# Patient Record
Sex: Male | Born: 1975 | Race: Black or African American | Hispanic: No | Marital: Married | Smoking: Never smoker
Health system: Southern US, Community
[De-identification: ages and names within clinical notes are randomized; demographics above are authoritative.]

## PROBLEM LIST (undated history)

## (undated) DIAGNOSIS — K219 Gastro-esophageal reflux disease without esophagitis: Secondary | ICD-10-CM

## (undated) DIAGNOSIS — J302 Other seasonal allergic rhinitis: Secondary | ICD-10-CM

## (undated) DIAGNOSIS — E785 Hyperlipidemia, unspecified: Secondary | ICD-10-CM

## (undated) DIAGNOSIS — S83209A Unspecified tear of unspecified meniscus, current injury, unspecified knee, initial encounter: Secondary | ICD-10-CM

## (undated) HISTORY — DX: Gastro-esophageal reflux disease without esophagitis: K21.9

## (undated) HISTORY — DX: Unspecified tear of unspecified meniscus, current injury, unspecified knee, initial encounter: S83.209A

## (undated) HISTORY — DX: Other seasonal allergic rhinitis: J30.2

## (undated) HISTORY — PX: VASECTOMY: SHX75

## (undated) HISTORY — DX: Hyperlipidemia, unspecified: E78.5

---

## 2005-04-20 HISTORY — PX: MASS EXCISION: SHX2000

## 2010-09-07 ENCOUNTER — Inpatient Hospital Stay (INDEPENDENT_AMBULATORY_CARE_PROVIDER_SITE_OTHER)
Admission: RE | Admit: 2010-09-07 | Discharge: 2010-09-07 | Disposition: A | Discharge status: Home / Self Care | Payer: BC Managed Care – PPO | Source: Ambulatory Visit | Attending: Family Medicine | Admitting: Family Medicine

## 2010-09-07 DIAGNOSIS — IMO0002 Reserved for concepts with insufficient information to code with codable children: Secondary | ICD-10-CM

## 2011-09-08 ENCOUNTER — Ambulatory Visit: Payer: BC Managed Care – PPO | Admitting: Family Medicine

## 2011-09-08 DIAGNOSIS — Z0289 Encounter for other administrative examinations: Secondary | ICD-10-CM

## 2012-05-13 ENCOUNTER — Encounter: Payer: Self-pay | Admitting: Family Medicine

## 2012-05-13 ENCOUNTER — Ambulatory Visit (INDEPENDENT_AMBULATORY_CARE_PROVIDER_SITE_OTHER): Payer: BC Managed Care – PPO | Admitting: Family Medicine

## 2012-05-13 VITALS — BP 118/76 | HR 94 | Temp 98.7°F | Ht 70.5 in | Wt 239.3 lb

## 2012-05-13 DIAGNOSIS — E785 Hyperlipidemia, unspecified: Secondary | ICD-10-CM

## 2012-05-13 LAB — HEPATIC FUNCTION PANEL
ALT: 30 U/L (ref 0–53)
AST: 20 U/L (ref 0–37)
Bilirubin, Direct: 0.2 mg/dL (ref 0.0–0.3)
Total Bilirubin: 0.3 mg/dL (ref 0.3–1.2)
Total Protein: 7.8 g/dL (ref 6.0–8.3)

## 2012-05-13 LAB — BASIC METABOLIC PANEL
BUN: 12 mg/dL (ref 6–23)
CO2: 28 mEq/L (ref 19–32)
Chloride: 102 mEq/L (ref 96–112)
Creatinine, Ser: 1 mg/dL (ref 0.4–1.5)
Potassium: 3.7 mEq/L (ref 3.5–5.1)

## 2012-05-13 LAB — LIPID PANEL
Cholesterol: 294 mg/dL — ABNORMAL HIGH (ref 0–200)
Total CHOL/HDL Ratio: 10
Triglycerides: 456 mg/dL — ABNORMAL HIGH (ref 0.0–149.0)

## 2012-05-13 LAB — LDL CHOLESTEROL, DIRECT: Direct LDL: 185.1 mg/dL

## 2012-05-13 NOTE — Progress Notes (Signed)
Subjective:    Vincent Pollard is a 37 y.o. male here for follow up of dyslipidemia. The patient does not use medications that may worsen dyslipidemias (corticosteroids, progestins, anabolic steroids, diuretics, beta-blockers, amiodarone, cyclosporine, olanzapine). The patient exercises infrequently. The patient is not known to have coexisting coronary artery disease.   Cardiac Risk Factors Age > 45-male, > 55-male:  NO  Smoking:   NO  Sig. family hx of CHD*:  YES  +1  Hypertension:   NO  Diabetes:   NO  HDL < 35:   NO  HDL > 59:   NO  Total: 1   *- Sig. family h/o CHD per NCEP = MI or sudden death at <55yo in  father or other 1st-degree male relative, or <65yo in mother or  other 1st-degree male relative  The following portions of the patient's history were reviewed and updated as appropriate:  He  has a past medical history of Hyperlipidemia. He  does not have a problem list on file. He  has past surgical history that includes Mass excision (2007). His family history includes Diabetes in his maternal grandfather, maternal grandmother, and mother; Hypertension in his father, mother, and sister; and Stroke in his maternal grandfather. He  reports that he has never smoked. He has never used smokeless tobacco. He reports that he does not drink alcohol or use illicit drugs. He currently has no medications in their medication list. No current outpatient prescriptions on file prior to visit.   He is allergic to penicillins..  Review of Systems Pertinent items are noted in HPI.    Objective:    BP 118/76  Pulse 94  Temp 98.7 F (37.1 C) (Oral)  Ht 5' 10.5" (1.791 m)  Wt 239 lb 4.8 oz (108.546 kg)  BMI 33.85 kg/m2  SpO2 98% General appearance: alert, cooperative, appears stated age and no distress Lungs: clear to auscultation bilaterally Heart: regular rate and rhythm, S1, S2 normal, no murmur, click, rub or gallop Extremities: extremities normal, atraumatic, no cyanosis  or edema  Lab Review No results found for this basename: CHOL,  TRIGLYCERIDE,  HDL,  LDLDIRECT      Assessment:    Dyslipidemia as detailed above with 1 CHD risk factors using NCEP scheme above.  Target levels for LDL are: < 130 mg/dl (2 or more risk factors are present)  Explained to the patient the respective contributions of genetics, diet, and exercise to lipid levels and the use of medication in severe cases which do not respond to lifestyle alteration. The patient's interest and motivation in making lifestyle changes seems excellent.    Plan:    The following changes are planned for the next 3 months, at which time the patient will return for repeat fasting lipids:  1. Dietary changes: Increase soluble fiber Plant sterols 2grams per day (e.g. Benecol): yes Reduce saturated fat, "trans" monounsaturated fatty acids, and cholesterol 2. Exercise changes:  advised to con't to work on exercise 3. Other treatment: None 4. Lipid-lowering medications: None at present. Will consider if LDL > 130 on recheck.  (Recommended by NCEP after 3-6 mos of dietary therapy & lifestyle modification,  except if CHD is present or LDL well above 190.) 5. 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 Screening for secondary causes of dyslipidemias: None indicated 7. Lipid screening for relatives: na 8. Follow up: 3 months.  Note: The majority of the visit was spent in counseling on the pathophysiology and treatment of dyslipidemias. The  total face-to-face time was in excess of 25 minutes.

## 2012-05-13 NOTE — Patient Instructions (Addendum)

## 2013-04-20 HISTORY — PX: SHOULDER SURGERY: SHX246

## 2013-06-01 ENCOUNTER — Emergency Department (HOSPITAL_COMMUNITY): Payer: BC Managed Care – PPO

## 2013-06-01 ENCOUNTER — Emergency Department (INDEPENDENT_AMBULATORY_CARE_PROVIDER_SITE_OTHER)
Admission: EM | Admit: 2013-06-01 | Discharge: 2013-06-01 | Disposition: A | Discharge status: Nursing Home (Includes ICF) | Payer: BC Managed Care – PPO | Source: Home / Self Care | Attending: Emergency Medicine | Admitting: Emergency Medicine

## 2013-06-01 ENCOUNTER — Emergency Department (INDEPENDENT_AMBULATORY_CARE_PROVIDER_SITE_OTHER): Payer: BC Managed Care – PPO

## 2013-06-01 ENCOUNTER — Encounter (HOSPITAL_COMMUNITY): Payer: Self-pay | Admitting: Emergency Medicine

## 2013-06-01 ENCOUNTER — Emergency Department (HOSPITAL_COMMUNITY)
Admission: EM | Admit: 2013-06-01 | Discharge: 2013-06-01 | Disposition: A | Discharge status: Home / Self Care | Payer: BC Managed Care – PPO | Attending: Emergency Medicine | Admitting: Emergency Medicine

## 2013-06-01 DIAGNOSIS — Z8639 Personal history of other endocrine, nutritional and metabolic disease: Secondary | ICD-10-CM | POA: Insufficient documentation

## 2013-06-01 DIAGNOSIS — Z880 Allergy status to penicillin: Secondary | ICD-10-CM | POA: Insufficient documentation

## 2013-06-01 DIAGNOSIS — Y92410 Unspecified street and highway as the place of occurrence of the external cause: Secondary | ICD-10-CM | POA: Insufficient documentation

## 2013-06-01 DIAGNOSIS — M5412 Radiculopathy, cervical region: Secondary | ICD-10-CM

## 2013-06-01 DIAGNOSIS — S139XXA Sprain of joints and ligaments of unspecified parts of neck, initial encounter: Secondary | ICD-10-CM

## 2013-06-01 DIAGNOSIS — M542 Cervicalgia: Secondary | ICD-10-CM

## 2013-06-01 DIAGNOSIS — Y9389 Activity, other specified: Secondary | ICD-10-CM | POA: Insufficient documentation

## 2013-06-01 DIAGNOSIS — S43429A Sprain of unspecified rotator cuff capsule, initial encounter: Secondary | ICD-10-CM

## 2013-06-01 DIAGNOSIS — S0993XA Unspecified injury of face, initial encounter: Secondary | ICD-10-CM | POA: Insufficient documentation

## 2013-06-01 DIAGNOSIS — T148XXA Other injury of unspecified body region, initial encounter: Secondary | ICD-10-CM

## 2013-06-01 DIAGNOSIS — S46019A Strain of muscle(s) and tendon(s) of the rotator cuff of unspecified shoulder, initial encounter: Secondary | ICD-10-CM

## 2013-06-01 DIAGNOSIS — S199XXA Unspecified injury of neck, initial encounter: Principal | ICD-10-CM

## 2013-06-01 DIAGNOSIS — S46909A Unspecified injury of unspecified muscle, fascia and tendon at shoulder and upper arm level, unspecified arm, initial encounter: Secondary | ICD-10-CM | POA: Insufficient documentation

## 2013-06-01 DIAGNOSIS — S161XXA Strain of muscle, fascia and tendon at neck level, initial encounter: Secondary | ICD-10-CM

## 2013-06-01 DIAGNOSIS — Z862 Personal history of diseases of the blood and blood-forming organs and certain disorders involving the immune mechanism: Secondary | ICD-10-CM | POA: Insufficient documentation

## 2013-06-01 DIAGNOSIS — S4980XA Other specified injuries of shoulder and upper arm, unspecified arm, initial encounter: Secondary | ICD-10-CM | POA: Insufficient documentation

## 2013-06-01 DIAGNOSIS — IMO0002 Reserved for concepts with insufficient information to code with codable children: Secondary | ICD-10-CM | POA: Insufficient documentation

## 2013-06-01 MED ORDER — ACETAMINOPHEN 325 MG PO TABS
650.0000 mg | ORAL_TABLET | Freq: Once | ORAL | Status: AC
  Administered 2013-06-01: 650 mg via ORAL

## 2013-06-01 MED ORDER — ACETAMINOPHEN 325 MG PO TABS
ORAL_TABLET | ORAL | Status: AC
  Filled 2013-06-01: qty 2

## 2013-06-01 MED ORDER — NAPROXEN 500 MG PO TABS: 500.0000 mg | ORAL_TABLET | Freq: Two times a day (BID) | ORAL | Status: DC

## 2013-06-01 MED ORDER — TRAMADOL HCL 50 MG PO TABS: 50.0000 mg | ORAL_TABLET | Freq: Four times a day (QID) | ORAL | Status: DC | PRN

## 2013-06-01 MED ORDER — METHOCARBAMOL 500 MG PO TABS: 1000.0000 mg | ORAL_TABLET | Freq: Four times a day (QID) | ORAL | Status: DC

## 2013-06-01 NOTE — Discharge Instructions (Signed)
We have determined that your problem requires further evaluation in the emergency department.  We will take care of your transport there.  Once at the emergency department, you will be evaluated by a provider and they will order whatever treatment or tests they deem necessary.  We cannot guarantee that they will do any specific test or do any specific treatment.  ° °
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
## 2013-06-01 NOTE — ED Notes (Signed)
[place]

## 2013-06-01 NOTE — ED Notes (Signed)
pty  Reports  Weakness and  Tingling  l  Arm  And  Pain l  shiulder  Neck   Area  He  Remains  Awake  Alert
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
## 2013-06-08 ENCOUNTER — Ambulatory Visit (INDEPENDENT_AMBULATORY_CARE_PROVIDER_SITE_OTHER): Payer: BC Managed Care – PPO | Admitting: Family Medicine

## 2013-06-08 ENCOUNTER — Encounter: Payer: Self-pay | Admitting: Family Medicine

## 2013-06-08 VITALS — BP 124/70 | HR 102 | Temp 99.2°F | Wt 230.0 lb

## 2013-06-08 DIAGNOSIS — M25519 Pain in unspecified shoulder: Secondary | ICD-10-CM

## 2013-06-08 DIAGNOSIS — S139XXA Sprain of joints and ligaments of unspecified parts of neck, initial encounter: Secondary | ICD-10-CM

## 2013-06-08 DIAGNOSIS — E669 Obesity, unspecified: Secondary | ICD-10-CM | POA: Insufficient documentation

## 2013-06-08 DIAGNOSIS — M25512 Pain in left shoulder: Secondary | ICD-10-CM | POA: Insufficient documentation

## 2013-06-08 MED ORDER — HYDROCODONE-ACETAMINOPHEN 5-325 MG PO TABS: 1.0000 | ORAL_TABLET | Freq: Four times a day (QID) | ORAL | Status: DC | PRN

## 2013-06-08 MED ORDER — TRAMADOL HCL 50 MG PO TABS: 50.0000 mg | ORAL_TABLET | Freq: Four times a day (QID) | ORAL | Status: DC | PRN

## 2013-06-08 MED ORDER — CYCLOBENZAPRINE HCL 10 MG PO TABS: 10.0000 mg | ORAL_TABLET | Freq: Three times a day (TID) | ORAL | Status: DC | PRN
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
## 2013-06-08 NOTE — Assessment & Plan Note (Signed)
Change meds--  D/c robaxin --not effective Can still use tramadol during day vicodin and flexeril for night F/u ortho Sling given to pt

## 2013-06-08 NOTE — Progress Notes (Signed)
Pre visit review using our clinic review tool, if applicable. No additional management support is needed unless otherwise documented below in the visit note. 
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
## 2013-06-14 ENCOUNTER — Telehealth: Payer: Self-pay | Admitting: *Deleted
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
## 2013-06-14 NOTE — Telephone Encounter (Signed)
Ok to give note for light duty for 2 weeks--- lift no more than 10 lbs

## 2013-06-16 NOTE — Telephone Encounter (Signed)
Patient will pick up letter on Monday, I advised if he can get a fax number he could call back with and I can fax it. He agreed and will call if he can get a fax number     KP

## 2013-08-18 ENCOUNTER — Other Ambulatory Visit: Payer: Self-pay | Admitting: Family Medicine

## 2013-08-23 ENCOUNTER — Other Ambulatory Visit: Payer: Self-pay | Admitting: Family Medicine

## 2013-09-12 ENCOUNTER — Other Ambulatory Visit: Payer: Self-pay | Admitting: Specialist

## 2013-09-12 DIAGNOSIS — M25512 Pain in left shoulder: Secondary | ICD-10-CM

## 2013-09-19 ENCOUNTER — Other Ambulatory Visit: Payer: BC Managed Care – PPO

## 2015-09-26 ENCOUNTER — Encounter: Payer: Self-pay | Admitting: Family Medicine

## 2015-09-26 ENCOUNTER — Ambulatory Visit (INDEPENDENT_AMBULATORY_CARE_PROVIDER_SITE_OTHER): Payer: BLUE CROSS/BLUE SHIELD | Admitting: Family Medicine

## 2015-09-26 VITALS — BP 124/72 | HR 88 | Temp 98.5°F | Ht 70.5 in | Wt 225.4 lb

## 2015-09-26 DIAGNOSIS — E785 Hyperlipidemia, unspecified: Secondary | ICD-10-CM

## 2015-09-26 DIAGNOSIS — R079 Chest pain, unspecified: Secondary | ICD-10-CM | POA: Insufficient documentation

## 2015-09-26 DIAGNOSIS — R0789 Other chest pain: Secondary | ICD-10-CM

## 2015-09-26 LAB — LIPID PANEL
Cholesterol: 275 mg/dL — ABNORMAL HIGH (ref 0–200)
HDL: 38 mg/dL — ABNORMAL LOW (ref >39.00)
LDL CALC: 213 mg/dL — AB (ref 0–99)
NonHDL: 237.38
Total CHOL/HDL Ratio: 7
Triglycerides: 122 mg/dL (ref 0.0–149.0)
VLDL: 24.4 mg/dL (ref 0.0–40.0)

## 2015-09-26 LAB — COMPREHENSIVE METABOLIC PANEL
ALBUMIN: 4.9 g/dL (ref 3.5–5.2)
ALK PHOS: 39 U/L (ref 39–117)
ALT: 28 U/L (ref 0–53)
AST: 15 U/L (ref 0–37)
BUN: 11 mg/dL (ref 6–23)
CO2: 31 mEq/L (ref 19–32)
Calcium: 10.1 mg/dL (ref 8.4–10.5)
Chloride: 102 mEq/L (ref 96–112)
Creatinine, Ser: 1.01 mg/dL (ref 0.40–1.50)
GFR: 104.61 mL/min (ref >60.00)
Glucose, Bld: 113 mg/dL — ABNORMAL HIGH (ref 70–99)
POTASSIUM: 4.2 meq/L (ref 3.5–5.1)
SODIUM: 140 meq/L (ref 135–145)
TOTAL PROTEIN: 7.9 g/dL (ref 6.0–8.3)
Total Bilirubin: 0.6 mg/dL (ref 0.2–1.2)

## 2015-09-26 LAB — CBC WITH DIFFERENTIAL/PLATELET
BASOS PCT: 0.4 % (ref 0.0–3.0)
Basophils Absolute: 0 10*3/uL (ref 0.0–0.1)
EOS PCT: 2.5 % (ref 0.0–5.0)
Eosinophils Absolute: 0.2 10*3/uL (ref 0.0–0.7)
HCT: 50.4 % (ref 39.0–52.0)
HEMOGLOBIN: 16.7 g/dL (ref 13.0–17.0)
Lymphocytes Relative: 31.4 % (ref 12.0–46.0)
Lymphs Abs: 2 10*3/uL (ref 0.7–4.0)
MCHC: 33.2 g/dL (ref 30.0–36.0)
MCV: 89.2 fl (ref 78.0–100.0)
MONOS PCT: 9.3 % (ref 3.0–12.0)
Monocytes Absolute: 0.6 10*3/uL (ref 0.1–1.0)
Neutro Abs: 3.5 10*3/uL (ref 1.4–7.7)
Neutrophils Relative %: 56.4 % (ref 43.0–77.0)
Platelets: 283 10*3/uL (ref 150.0–400.0)
RBC: 5.65 Mil/uL (ref 4.22–5.81)
RDW: 13 % (ref 11.5–15.5)
WBC: 6.2 10*3/uL (ref 4.0–10.5)

## 2015-09-26 LAB — EKG 12-LEAD

## 2015-09-26 NOTE — Assessment & Plan Note (Addendum)
Atypical--- no more episodes since last weekend Pt instructed to go to ER if he has anymore CP

## 2015-09-26 NOTE — Patient Instructions (Addendum)
Nonspecific Chest Pain  Chest pain can be caused by many different conditions. There is always a chance that your pain could be related to something serious, such as a heart attack or a blood clot in your lungs. Chest pain can also be caused by conditions that are not life-threatening. If you have chest pain, it is very important to follow up with your health care provider. CAUSES  Chest pain can be caused by:  Heartburn.  Pneumonia or bronchitis.  Anxiety or stress.  Inflammation around your heart (pericarditis) or lung (pleuritis or pleurisy).  A blood clot in your lung.  A collapsed lung (pneumothorax). It can develop suddenly on its own (spontaneous pneumothorax) or from trauma to the chest.  Shingles infection (varicella-zoster virus).  Heart attack.  Damage to the bones, muscles, and cartilage that make up your chest wall. This can include:  Bruised bones due to injury.  Strained muscles or cartilage due to frequent or repeated coughing or overwork.  Fracture to one or more ribs.  Sore cartilage due to inflammation (costochondritis). RISK FACTORS  Risk factors for chest pain may include:  Activities that increase your risk for trauma or injury to your chest.  Respiratory infections or conditions that cause frequent coughing.  Medical conditions or overeating that can cause heartburn.  Heart disease or family history of heart disease.  Conditions or health behaviors that increase your risk of developing a blood clot.  Having had chicken pox (varicella zoster). SIGNS AND SYMPTOMS Chest pain can feel like:  Burning or tingling on the surface of your chest or deep in your chest.  Crushing, pressure, aching, or squeezing pain.  Dull or sharp pain that is worse when you move, cough, or take a deep breath.  Pain that is also felt in your back, neck, shoulder, or arm, or pain that spreads to any of these areas. Your chest pain may come and go, or it may stay  constant. DIAGNOSIS Lab tests or other studies may be needed to find the cause of your pain. Your health care provider may have you take a test called an ambulatory ECG (electrocardiogram). An ECG records your heartbeat patterns at the time the test is performed. You may also have other tests, such as:  Transthoracic echocardiogram (TTE). During echocardiography, sound waves are used to create a picture of all of the heart structures and to look at how blood flows through your heart.  Transesophageal echocardiogram (TEE).This is a more advanced imaging test that obtains images from inside your body. It allows your health care provider to see your heart in finer detail.  Cardiac monitoring. This allows your health care provider to monitor your heart rate and rhythm in real time.  Holter monitor. This is a portable device that records your heartbeat and can help to diagnose abnormal heartbeats. It allows your health care provider to track your heart activity for several days, if needed.  Stress tests. These can be done through exercise or by taking medicine that makes your heart beat more quickly.  Blood tests.  Imaging tests. TREATMENT  Your treatment depends on what is causing your chest pain. Treatment may include:  Medicines. These may include:  Acid blockers for heartburn.  Anti-inflammatory medicine.  Pain medicine for inflammatory conditions.  Antibiotic medicine, if an infection is present.  Medicines to dissolve blood clots.  Medicines to treat coronary artery disease.  Supportive care for conditions that do not require medicines. This may include:  Resting.  Applying heat  or cold packs to injured areas.  Limiting activities until pain decreases. HOME CARE INSTRUCTIONS  If you were prescribed an antibiotic medicine, finish it all even if you start to feel better.  Avoid any activities that bring on chest pain.  Do not use any tobacco products, including  cigarettes, chewing tobacco, or electronic cigarettes. If you need help quitting, ask your health care provider.  Do not drink alcohol.  Take medicines only as directed by your health care provider.  Keep all follow-up visits as directed by your health care provider. This is important. This includes any further testing if your chest pain does not go away.  If heartburn is the cause for your chest pain, you may be told to keep your head raised (elevated) while sleeping. This reduces the chance that acid will go from your stomach into your esophagus.  Make lifestyle changes as directed by your health care provider. These may include:  Getting regular exercise. Ask your health care provider to suggest some activities that are safe for you.  Eating a heart-healthy diet. A registered dietitian can help you to learn healthy eating options.  Maintaining a healthy weight.  Managing diabetes, if necessary.  Reducing stress. SEEK MEDICAL CARE IF:  Your chest pain does not go away after treatment.  You have a rash with blisters on your chest.  You have a fever. SEEK IMMEDIATE MEDICAL CARE IF:   Your chest pain is worse.  You have an increasing cough, or you cough up blood.  You have severe abdominal pain.  You have severe weakness.  You faint.  You have chills.  You have sudden, unexplained chest discomfort.  You have sudden, unexplained discomfort in your arms, back, neck, or jaw.  You have shortness of breath at any time.  You suddenly start to sweat, or your skin gets clammy.  You feel nauseous or you vomit.  You suddenly feel light-headed or dizzy.  Your heart begins to beat quickly, or it feels like it is skipping beats. These symptoms may represent a serious problem that is an emergency. Do not wait to see if the symptoms will go away. Get medical help right away. Call your local emergency services (911 in the [place] Do not drive yourself to the hospital.   This  information is not intended to replace advice given to you by your health care provider. Make sure you discuss any questions you have with your health care provider.   Document Released: 01/14/2005 Document Revised: 04/27/2014 Document Reviewed: 11/10/2013 Elsevier Interactive Patient Education [employer]. 3.
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
## 2015-09-26 NOTE — Progress Notes (Signed)
Pre visit review using our clinic review tool, if applicable. No additional management support is needed unless otherwise documented below in the visit note. 

## 2015-09-27 ENCOUNTER — Encounter: Payer: Self-pay | Admitting: Family Medicine

## 2015-09-27 ENCOUNTER — Other Ambulatory Visit: Payer: Self-pay

## 2015-09-27 DIAGNOSIS — R079 Chest pain, unspecified: Secondary | ICD-10-CM

## 2015-09-27 DIAGNOSIS — R9431 Abnormal electrocardiogram [ECG] [EKG]: Secondary | ICD-10-CM

## 2015-09-27 NOTE — Telephone Encounter (Signed)
-----   Message from Ann Held, [place] sent at 09/26/2015  5:48 PM EDT ----- Refer to cardiology--- dx atypical chest pain , abn ekg ----- Message -----    From: Synthia Innocent    Sent: 09/26/2015  12:59 PM      To: Ann Held, DO  Will need peer to peer review for Echo and Stress test, please call [facility] @ [phone], ID # [id number]. Thanks

## 2015-09-27 NOTE — Telephone Encounter (Signed)
Discussed with the patient as well as discussed the above message with him, I made him aware the insurance company will not pay for the Echo and stress test so we will send him to the cardiologist, he verbalized understanding, the referral has been placed. I advised in 2014 we were hoping he would watch his diet and exercise and recheck in 3 months but he never came back, he voiced understanding, I advised once his labs has been resulted she may place him on a medication and if so I would call him back and let him know, he verbalized understanding and was ok with that, he stated he eats out all of the time because his wife can not cook, I advised him to watch what he eats and increase his exercise. He said he would.   KP

## 2015-10-08 ENCOUNTER — Encounter: Payer: Self-pay | Admitting: Family Medicine

## 2015-10-08 MED ORDER — ATORVASTATIN CALCIUM 20 MG PO TABS: 20.0000 mg | ORAL_TABLET | Freq: Every day | ORAL | Status: DC

## 2015-10-10 ENCOUNTER — Telehealth (HOSPITAL_COMMUNITY): Payer: Self-pay | Admitting: *Deleted

## 2015-10-10 NOTE — Telephone Encounter (Signed)
Left message on voicemail per DPR in reference to upcoming appointment scheduled on 6/27/17with detailed instructions given per Myocardial Perfusion Study Information Sheet for the test. LM to arrive 15 minutes early, and that it is imperative to arrive on time for appointment to keep from having the test rescheduled. If you need to cancel or reschedule your appointment, please call the office within 24 hours of your appointment. Failure to do so may result in a cancellation of your appointment, and a $50 no show fee. Phone number given for call back for any questions. Hubbard Robinson, RN

## 2015-10-11 ENCOUNTER — Telehealth: Payer: Self-pay | Admitting: Cardiovascular Disease

## 2015-10-11 NOTE — Telephone Encounter (Signed)
Informed Maudie Mercury that patient should keep echo appointment 6/27. Will forward to Dr. Johnsie Cancel for further advisement. Patient has office visit on 6/28.

## 2015-10-11 NOTE — Telephone Encounter (Signed)
New message   Joelene Millin is calling to see if the echo needs to be canceled and if Dr. Johnsie Cancel may want to order something else after he sees the pt.  She was told that the nuc stress was not approved, but the echo was approved  Please call back to advise

## 2015-10-11 NOTE — Telephone Encounter (Signed)
Will likely order ETT after new patient visit

## 2015-10-11 NOTE — Telephone Encounter (Signed)
Wife has been made aware to keep the Cardiology apt and the Echo apt on 10/15/15

## 2015-10-15 ENCOUNTER — Other Ambulatory Visit: Payer: Self-pay

## 2015-10-15 ENCOUNTER — Encounter (HOSPITAL_COMMUNITY): Payer: BLUE CROSS/BLUE SHIELD

## 2015-10-15 ENCOUNTER — Ambulatory Visit (HOSPITAL_COMMUNITY): Payer: BLUE CROSS/BLUE SHIELD | Attending: Cardiology

## 2015-10-15 DIAGNOSIS — R079 Chest pain, unspecified: Secondary | ICD-10-CM | POA: Diagnosis present

## 2015-10-15 DIAGNOSIS — Z8249 Family history of ischemic heart disease and other diseases of the circulatory system: Secondary | ICD-10-CM | POA: Diagnosis not present

## 2015-10-15 DIAGNOSIS — E785 Hyperlipidemia, unspecified: Secondary | ICD-10-CM | POA: Diagnosis not present

## 2015-10-15 DIAGNOSIS — R0789 Other chest pain: Secondary | ICD-10-CM

## 2015-10-15 DIAGNOSIS — I517 Cardiomegaly: Secondary | ICD-10-CM | POA: Diagnosis not present

## 2015-10-15 DIAGNOSIS — I340 Nonrheumatic mitral (valve) insufficiency: Secondary | ICD-10-CM | POA: Diagnosis not present

## 2015-10-15 LAB — ECHOCARDIOGRAM COMPLETE
AOASC: 30 cm
CHL CUP DOP CALC LVOT VTI: 20.7 cm
E decel time: 250 msec
EERAT: 5.94
FS: 34 % (ref 28–44)
IV/PV OW: 1.05
LA ID, A-P, ES: 40 mm
LA vol A4C: 51.8 ml
LA vol index: 26 mL/m2
LADIAMINDEX: 1.81 cm/m2
LAVOL: 57.4 mL
LEFT ATRIUM END SYS DIAM: 40 mm
LV E/e' medial: 5.94
LV E/e'average: 5.94
LV PW d: 10.9 mm — AB (ref 0.6–1.1)
LV e' LATERAL: 12.8 cm/s
LVOT SV: 94 mL
LVOT area: 4.52 cm2
LVOT peak vel: 101 cm/s
LVOTD: 24 mm
MV Dec: 250
MV pk E vel: 76 m/s
MVPG: 2 mmHg
MVPKAVEL: 59.5 m/s
TAPSE: 21.3 mm
TDI e' lateral: 12.8
TDI e' medial: 8.49
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
## 2015-10-16 ENCOUNTER — Ambulatory Visit (INDEPENDENT_AMBULATORY_CARE_PROVIDER_SITE_OTHER): Payer: BLUE CROSS/BLUE SHIELD | Admitting: Cardiovascular Disease

## 2015-10-16 ENCOUNTER — Encounter: Payer: Self-pay | Admitting: Cardiovascular Disease

## 2015-10-16 VITALS — BP 122/74 | HR 78 | Ht 72.0 in | Wt 227.4 lb

## 2015-10-16 DIAGNOSIS — R0789 Other chest pain: Secondary | ICD-10-CM

## 2015-10-16 NOTE — Patient Instructions (Addendum)
Medication Instructions:  Your physician recommends that you continue on your current medications as directed. Please refer to the Current Medication list given to you today.  Labwork: NONE  Testing/Procedures: Your physician has requested that you have an exercise tolerance test. For further information please visit [email or web address]. Please also follow instruction sheet, as given.  Follow-Up: Your physician wants you to follow-up as needed with Dr. Nishan.    If you need a refill on your cardiac medications before your next appointment, please call your pharmacy.    

## 2015-11-19 DIAGNOSIS — R0989 Other specified symptoms and signs involving the circulatory and respiratory systems: Secondary | ICD-10-CM

## 2015-12-31 ENCOUNTER — Telehealth: Payer: Self-pay | Admitting: Cardiovascular Disease

## 2015-12-31 NOTE — Telephone Encounter (Signed)
I have left several messages for pt to call back and schedule GXT.  Pt has scheduled 2 times and have not kept either appt. I have removed the order from the workque due to reaching the 3rd call no response.   12/31/2015 LMOM for pt to call and schedule GXT. stpegram  12/17/2015 LMOM for pt to call and schedule GXT advised pt if he was not interested in doing the test to please advise so we can let Dr Johnsie Cancel  know why this test has not been scheduled. stpegram 11/27/2015 LMOM for pt to call and scheudle GXT. stpegram  11/19/2015 NO SHOW.

## 2016-01-02 ENCOUNTER — Encounter: Payer: Self-pay | Admitting: Family Medicine

## 2016-01-02 DIAGNOSIS — Z0289 Encounter for other administrative examinations: Secondary | ICD-10-CM

## 2016-01-27 ENCOUNTER — Other Ambulatory Visit: Payer: Self-pay | Admitting: Family Medicine

## 2016-03-06 ENCOUNTER — Other Ambulatory Visit: Payer: Self-pay | Admitting: Family Medicine

## 2016-04-16 ENCOUNTER — Telehealth: Payer: Self-pay | Admitting: Medical

## 2016-04-16 ENCOUNTER — Encounter: Payer: Self-pay | Admitting: Medical

## 2016-04-16 ENCOUNTER — Ambulatory Visit (INDEPENDENT_AMBULATORY_CARE_PROVIDER_SITE_OTHER): Payer: BLUE CROSS/BLUE SHIELD | Admitting: Medical

## 2016-04-16 VITALS — BP 120/74 | HR 89 | Temp 98.0°F | Wt 231.8 lb

## 2016-04-16 DIAGNOSIS — J0100 Acute maxillary sinusitis, unspecified: Secondary | ICD-10-CM | POA: Diagnosis not present

## 2016-04-16 DIAGNOSIS — R059 Cough, unspecified: Secondary | ICD-10-CM

## 2016-04-16 DIAGNOSIS — R05 Cough: Secondary | ICD-10-CM

## 2016-04-16 LAB — POC INFLUENZA A&B (BINAX/QUICKVUE)
INFLUENZA A, POC: NEGATIVE
INFLUENZA B, POC: NEGATIVE

## 2016-04-16 MED ORDER — DOXYCYCLINE HYCLATE 100 MG PO TABS: 100.0000 mg | ORAL_TABLET | Freq: Two times a day (BID) | ORAL | 0 refills | Status: DC

## 2016-04-16 MED ORDER — FLUTICASONE PROPIONATE 50 MCG/ACT NA SUSP: 2.0000 | Freq: Every day | NASAL | 1 refills | Status: DC

## 2016-04-16 MED ORDER — BENZONATATE 100 MG PO CAPS: 100.0000 mg | ORAL_CAPSULE | Freq: Three times a day (TID) | ORAL | 0 refills | Status: DC | PRN

## 2016-04-16 MED ORDER — ATORVASTATIN CALCIUM 20 MG PO TABS: 20.0000 mg | ORAL_TABLET | Freq: Every day | ORAL | 0 refills | Status: DC

## 2016-04-16 NOTE — Patient Instructions (Addendum)
You appear to have a sinus infection. I am prescribing antibiotic doxycycline  for the infection. To help with the nasal congestion I prescribed flonase nasal steroid. For your associated cough , I prescribed cough medicine benzonatate.  Possible early bronchitis as well.  Rest, hydrate, tylenol for fever.  Follow up in 7 days or as needed.

## 2016-04-16 NOTE — Telephone Encounter (Signed)
Will you result pt flu test so I can close chart. Thanks.

## 2016-04-16 NOTE — Progress Notes (Signed)
Pre visit review using our clinic review tool, if applicable. No additional management support is needed unless otherwise documented below in the visit note. 

## 2016-04-16 NOTE — Progress Notes (Signed)
Subjective:    Patient ID: Vincent Pollard, male    DOB: [birth year], 40 y.o.   MRN: [id number]  HPI  Pt in states sick since last Friday. Nasal congestion, runny nose, sinus pain/pressure and some productive cough. When blows nose will gt mucous.   Pt states some very faint transient body aches that last 2 minutes the other day but not now.  Pt states no hx of chronic sinus infection or bronchitis. No wheezing.   Review of Systems  Constitutional: Positive for chills and fatigue. Negative for fever.       Can't sleep due to nasal congestion.  HENT: Positive for congestion, postnasal drip, sinus pain and sinus pressure. Negative for ear pain and sore throat.   Respiratory: Positive for cough. Negative for chest tightness, shortness of breath and wheezing.   Cardiovascular: Negative for chest pain and palpitations.  Gastrointestinal: Negative for abdominal pain.  Musculoskeletal: Negative for back pain and gait problem.  Neurological: Negative for dizziness and headaches.  Hematological: Negative for adenopathy. Does not bruise/bleed easily.  Psychiatric/Behavioral: Negative for behavioral problems, confusion, dysphoric mood and hallucinations.    Past Medical History:  Diagnosis Date  . Hyperlipidemia      Social History   Social History  . Marital status: Married    Spouse name: N/A  . Number of children: N/A  . Years of education: N/A   Occupational History  . Not on file.   Social History Main Topics  . Smoking status: Never Smoker  . Smokeless tobacco: Never Used  . Alcohol use No  . Drug use: No  . Sexual activity: Yes    Partners: Female   Other Topics Concern  . Not on file   Social History Narrative  . No narrative on file    Past Surgical History:  Procedure Laterality Date  . MASS EXCISION  2007   from the left thumb  . SHOULDER SURGERY Left 2015   dr Theda Sers    Family History  Problem Relation Age of Onset  . Hypertension Father   . Stroke  Maternal Grandfather   . Diabetes Maternal Grandfather   . Heart disease Maternal Grandfather   . Hypertension Sister   . Hypertension Mother   . Diabetes Mother   . Diabetes Maternal Grandmother   . Arthritis Paternal Grandmother   . Arthritis Paternal Grandfather     Allergies  Allergen Reactions  . Penicillins Other (See Comments)    Childhood reaction unknown    No current outpatient prescriptions on file prior to visit.   No current facility-administered medications on file prior to visit.     BP 120/74 (BP Location: Left Arm, Patient Position: Sitting, Cuff Size: Normal)   Pulse 89   Temp 98 F (36.7 C) (Oral)   Wt 231 lb 12.8 oz (105.1 kg)   SpO2 96%   BMI 31.44 kg/m       Objective:   Physical Exam  General  Mental Status - Alert. General Appearance - Well groomed. Not in acute distress.  Skin Rashes- No Rashes.  HEENT Head- Normal. Ear Auditory Canal - Left- Normal. Right - Normal.Tympanic Membrane- Left- Normal. Right- Normal. Eye Sclera/Conjunctiva- Left- Normal. Right- Normal. Nose & Sinuses Nasal Mucosa- Left-  Boggy and Congested. Right-  Boggy and  Congested.Bilateral maxillary and frontal sinus pressure. Mouth & Throat Lips: Upper Lip- Normal: no dryness, cracking, pallor, cyanosis, or vesicular eruption. Lower Lip-Normal: no dryness, cracking, pallor, cyanosis or vesicular eruption. Buccal Mucosa- Bilateral-  No Aphthous ulcers. Oropharynx- No Discharge or Erythema. Tonsils: Characteristics- Bilateral- No Erythema or Congestion. Size/Enlargement- Bilateral- No enlargement. Discharge- bilateral-None.  Neck Neck- Supple. No Masses.   Chest and Lung Exam Auscultation: Breath Sounds:-Clear even and unlabored.  Cardiovascular Auscultation:Rythm- Regular, rate and rhythm. Murmurs & Other Heart Sounds:Ausculatation of the heart reveal- No Murmurs.  Lymphatic Head & Neck General Head & Neck Lymphatics: Bilateral: Description- No Localized  lymphadenopathy.      Assessment & Plan:  You appear to have a sinus infection. I am prescribing antibiotic doxycycline  for the infection. To help with the nasal congestion I prescribed flonase nasal steroid. For your associated cough , I prescribed cough medicine benzonatate.  Possible early bronchitis as well.  Rest, hydrate, tylenol for fever.  Follow up in 7 days or as needed.  Pt flu test was negative today.

## 2016-09-20 ENCOUNTER — Other Ambulatory Visit: Payer: Self-pay | Admitting: Medical

## 2016-09-27 ENCOUNTER — Other Ambulatory Visit: Payer: Self-pay | Admitting: Medical

## 2016-09-29 NOTE — Telephone Encounter (Signed)
lvm advising patient of message below °

## 2016-09-29 NOTE — Telephone Encounter (Signed)
Pt due for follow up please call and schedule.  

## 2016-10-01 ENCOUNTER — Other Ambulatory Visit: Payer: Self-pay | Admitting: Family Medicine

## 2016-10-01 MED ORDER — FLUTICASONE PROPIONATE 50 MCG/ACT NA SUSP: 2.0000 | Freq: Every day | NASAL | 1 refills | Status: DC

## 2017-04-20 HISTORY — PX: MENISCUS REPAIR: SHX5179

## 2017-06-02 ENCOUNTER — Ambulatory Visit: Payer: Managed Care, Other (non HMO) | Admitting: Family Medicine

## 2017-06-02 ENCOUNTER — Encounter: Payer: Self-pay | Admitting: Family Medicine

## 2017-06-02 VITALS — BP 132/82 | HR 90 | Temp 98.3°F | Ht 71.0 in | Wt 241.0 lb

## 2017-06-02 DIAGNOSIS — J0100 Acute maxillary sinusitis, unspecified: Secondary | ICD-10-CM | POA: Diagnosis not present

## 2017-06-02 MED ORDER — DOXYCYCLINE HYCLATE 100 MG PO TABS: 100.0000 mg | ORAL_TABLET | Freq: Two times a day (BID) | ORAL | 0 refills | Status: DC

## 2017-06-02 NOTE — Progress Notes (Signed)
Chief Complaint  Patient presents with  . Sinusitis    Vincent Pollard here for URI complaints.  Duration: 2 days  Associated symptoms: sinus headache, sinus congestion, sinus pain and rhinorrhea Denies: itchy watery eyes, ear pain, ear drainage, sore throat, wheezing, shortness of breath, myalgia, cough and fevers Treatment to date: Nyquil, saline spray Sick contacts: Yes  ROS:  Const: Denies fevers HEENT: As noted in HPI Lungs: No SOB  Past Medical History:  Diagnosis Date  . Hyperlipidemia    Family History  Problem Relation Age of Onset  . Hypertension Father   . Stroke Maternal Grandfather   . Diabetes Maternal Grandfather   . Heart disease Maternal Grandfather   . Hypertension Sister   . Hypertension Mother   . Diabetes Mother   . Diabetes Maternal Grandmother   . Arthritis Paternal Grandmother   . Arthritis Paternal Grandfather     BP 132/82 (BP Location: Left Arm, Patient Position: Sitting, Cuff Size: Large)   Pulse 90   Temp 98.3 F (36.8 C) (Oral)   Ht 5\' 11"  (1.803 m)   Wt 241 lb (109.3 kg)   SpO2 96%   BMI 33.61 kg/m  General: Awake, alert, appears stated age HEENT: AT, [place], ears patent b/l and TM's neg, TTP over max sinuses b/l, nares patent w/o discharge, pharynx pink and without exudates, MMM Neck: No masses or asymmetry Heart: RRR Lungs: CTAB, no accessory muscle use Psych: Age appropriate judgment and insight, normal mood and affect  Acute maxillary sinusitis, recurrence not specified - Plan: doxycycline (VIBRA-TABS) 100 MG tablet  Orders as above. Pocket rx given w instructions. Ibuprofen, Tylenol.  Continue to push fluids, practice good hand hygiene, cover mouth when coughing. F/u prn. If starting to experience fevers, shaking, or shortness of breath, seek immediate care. Pt voiced understanding and agreement to the plan.  Laurys [place], DO 06/02/17 2:39 PM

## 2017-06-02 NOTE — Patient Instructions (Addendum)
Most sinus infections are viral in etiology and antibiotics will not be helpful. That being said, if you start having worsening symptoms over 3 days, you are worsening by day 10 or not improving by day 14, go ahead and take it. You are on Day 2 as of now.   Continue to push fluids, practice good hand hygiene, and cover your mouth if you cough.  If you start having fevers, shaking or shortness of breath, seek immediate care.  Ibuprofen 400-600 mg (2-3 over the counter strength tabs) every 6 hours as needed for pain.  OK to take Tylenol 1000 mg (2 extra strength tabs) or 975 mg (3 regular strength tabs) every 6 hours as needed.  Vick's Vaporub, air humidifier, nasal spray (Flonase), elevate head of bed.   Let us know if you need anything.

## 2017-06-02 NOTE — Progress Notes (Signed)
Pre visit review using our clinic review tool, if applicable. No additional management support is needed unless otherwise documented below in the visit note. 

## 2017-06-04 ENCOUNTER — Other Ambulatory Visit: Payer: Self-pay | Admitting: Family Medicine

## 2017-08-08 ENCOUNTER — Other Ambulatory Visit: Payer: Self-pay | Admitting: Family Medicine

## 2017-08-11 ENCOUNTER — Other Ambulatory Visit: Payer: Self-pay | Admitting: *Deleted

## 2017-08-11 MED ORDER — ATORVASTATIN CALCIUM 20 MG PO TABS: ORAL_TABLET | ORAL | 0 refills | Status: DC

## 2017-08-11 NOTE — Telephone Encounter (Signed)
Advised patient that he must keep appt in May to get more refills

## 2017-09-03 ENCOUNTER — Encounter: Payer: Managed Care, Other (non HMO) | Admitting: Family Medicine

## 2017-09-07 ENCOUNTER — Encounter: Payer: Self-pay | Admitting: Family Medicine

## 2017-09-07 ENCOUNTER — Ambulatory Visit (INDEPENDENT_AMBULATORY_CARE_PROVIDER_SITE_OTHER): Payer: Managed Care, Other (non HMO) | Admitting: Family Medicine

## 2017-09-07 VITALS — BP 129/77 | HR 70 | Temp 98.1°F | Resp 16 | Ht 72.0 in | Wt 236.6 lb

## 2017-09-07 DIAGNOSIS — E785 Hyperlipidemia, unspecified: Secondary | ICD-10-CM | POA: Diagnosis not present

## 2017-09-07 NOTE — Progress Notes (Signed)
Subjective:  I acted as a Education administrator for [employer]. Yancey Flemings, [place]   Patient ID: Vincent Pollard, male    DOB: 03/04/1976, 42 y.o.   MRN: [id number]  Chief Complaint  Patient presents with  . medication follow up    HPI  Patient is in today for medication follow up.  No complaints   Pt sometimes has tired legs but it is rare.     Patient Care Team: Carollee Herter, Alferd Apa, DO as PCP - General (Family Medicine)   Past Medical History:  Diagnosis Date  . Hyperlipidemia     Past Surgical History:  Procedure Laterality Date  . MASS EXCISION  2007   from the left thumb  . SHOULDER SURGERY Left 2015   dr Theda Sers    Family History  Problem Relation Age of Onset  . Hypertension Father   . Stroke Maternal Grandfather   . Diabetes Maternal Grandfather   . Heart disease Maternal Grandfather   . Hypertension Sister   . Hypertension Mother   . Diabetes Mother   . Diabetes Maternal Grandmother   . Arthritis Paternal Grandmother   . Arthritis Paternal Grandfather     Social History   Socioeconomic History  . Marital status: Married    Spouse name: Not on file  . Number of children: Not on file  . Years of education: Not on file  . Highest education level: Not on file  Occupational History  . Not on file  Social Needs  . Financial resource strain: Not on file  . Food insecurity:    Worry: Not on file    Inability: Not on file  . Transportation needs:    Medical: Not on file    Non-medical: Not on file  Tobacco Use  . Smoking status: Never Smoker  . Smokeless tobacco: Never Used  Substance and Sexual Activity  . Alcohol use: No  . Drug use: No  . Sexual activity: Yes    Partners: Female  Lifestyle  . Physical activity:    Days per week: Not on file    Minutes per session: Not on file  . Stress: Not on file  Relationships  . Social connections:    Talks on phone: Not on file    Gets together: Not on file    Attends religious service: Not on file    Active  member of club or organization: Not on file    Attends meetings of clubs or organizations: Not on file    Relationship status: Not on file  . Intimate partner violence:    Fear of current or ex partner: Not on file    Emotionally abused: Not on file    Physically abused: Not on file    Forced sexual activity: Not on file  Other Topics Concern  . Not on file  Social History Narrative  . Not on file    Outpatient Medications Prior to Visit  Medication Sig Dispense Refill  . atorvastatin (LIPITOR) 20 MG tablet TAKE 1 TABLET (20 MG TOTAL) BY MOUTH DAILY. 90 tablet 0  . benzonatate (TESSALON) 100 MG capsule Take 1 capsule (100 mg total) by mouth 3 (three) times daily as needed for cough. 21 capsule 0  . doxycycline (VIBRA-TABS) 100 MG tablet Take 1 tablet (100 mg total) by mouth 2 (two) times daily. 20 tablet 0  . fluticasone (FLONASE) 50 MCG/ACT nasal spray Place 2 sprays into both nostrils daily. 16 g 1   No facility-administered medications prior to  visit.     Allergies  Allergen Reactions  . Penicillins Other (See Comments)    Childhood reaction unknown    Review of Systems  Constitutional: Negative for chills, fever and malaise/fatigue.  HENT: Negative for congestion and hearing loss.   Eyes: Negative for discharge.  Respiratory: Negative for cough, sputum production and shortness of breath.   Cardiovascular: Negative for chest pain, palpitations and leg swelling.  Gastrointestinal: Negative for abdominal pain, blood in stool, constipation, diarrhea, heartburn, nausea and vomiting.  Genitourinary: Negative for dysuria, frequency, hematuria and urgency.  Musculoskeletal: Negative for back pain, falls and myalgias.  Skin: Negative for rash.  Neurological: Negative for dizziness, sensory change, loss of consciousness, weakness and headaches.  Endo/Heme/Allergies: Negative for environmental allergies. Does not bruise/bleed easily.  Psychiatric/Behavioral: Negative for depression  and suicidal ideas. The patient is not nervous/anxious and does not have insomnia.        Objective:    Physical Exam  Constitutional: He is oriented to person, place, and time. Vital signs are normal. He appears well-developed and well-nourished. He is sleeping. No distress.  HENT:  Head: Normocephalic and atraumatic.  Mouth/Throat: Oropharynx is clear and moist.  Eyes: Pupils are equal, round, and reactive to light. EOM are normal.  Neck: Normal range of motion. Neck supple. No thyromegaly present.  Cardiovascular: Normal rate, regular rhythm and normal heart sounds.  No murmur heard. Pulmonary/Chest: Effort normal and breath sounds normal. No respiratory distress. He has no wheezes. He has no rales. He exhibits no tenderness.  Musculoskeletal: He exhibits no edema or tenderness.  Neurological: He is alert and oriented to person, place, and time.  Skin: Skin is warm and dry.  Psychiatric: He has a normal mood and affect. His behavior is normal. Judgment and thought content normal.  Nursing note and vitals reviewed.   BP 129/77 (BP Location: Left Arm, Patient Position: Sitting, Cuff Size: Large)   Pulse 70   Temp 98.1 F (36.7 C) (Oral)   Resp 16   Ht 6' (1.829 m)   Wt 236 lb 9.6 oz (107.3 kg)   SpO2 98%   BMI 32.09 kg/m  Wt Readings from Last 3 Encounters:  09/07/17 236 lb 9.6 oz (107.3 kg)  06/02/17 241 lb (109.3 kg)  04/16/16 231 lb 12.8 oz (105.1 kg)   BP Readings from Last 3 Encounters:  09/07/17 129/77  06/02/17 132/82  04/16/16 120/74      There is no immunization history on file for this patient.  Health Maintenance  Topic Date Due  . HIV Screening  07/26/1989  . TETANUS/TDAP  09/08/2018 (Originally 07/26/1993)  . INFLUENZA VACCINE  11/18/2017    Lab Results  Component Value Date   WBC 6.2 09/26/2015   HGB 16.7 09/26/2015   HCT 50.4 09/26/2015   PLT 283.0 09/26/2015   GLUCOSE 113 (H) 09/26/2015   CHOL 275 (H) 09/26/2015   TRIG 122.0 09/26/2015    HDL 38.00 (L) 09/26/2015   LDLDIRECT 185.1 05/13/2012   LDLCALC 213 (H) 09/26/2015   ALT 28 09/26/2015   AST 15 09/26/2015   NA 140 09/26/2015   K 4.2 09/26/2015   CL 102 09/26/2015   CREATININE 1.01 09/26/2015   BUN 11 09/26/2015   CO2 31 09/26/2015    No results found for: TSH Lab Results  Component Value Date   WBC 6.2 09/26/2015   HGB 16.7 09/26/2015   HCT 50.4 09/26/2015   MCV 89.2 09/26/2015   PLT 283.0 09/26/2015   Lab  Results  Component Value Date   NA 140 09/26/2015   K 4.2 09/26/2015   CO2 31 09/26/2015   GLUCOSE 113 (H) 09/26/2015   BUN 11 09/26/2015   CREATININE 1.01 09/26/2015   BILITOT 0.6 09/26/2015   ALKPHOS 39 09/26/2015   AST 15 09/26/2015   ALT 28 09/26/2015   PROT 7.9 09/26/2015   ALBUMIN 4.9 09/26/2015   CALCIUM 10.1 09/26/2015   GFR 104.61 09/26/2015   Lab Results  Component Value Date   CHOL 275 (H) 09/26/2015   Lab Results  Component Value Date   HDL 38.00 (L) 09/26/2015   Lab Results  Component Value Date   LDLCALC 213 (H) 09/26/2015   Lab Results  Component Value Date   TRIG 122.0 09/26/2015   Lab Results  Component Value Date   CHOLHDL 7 09/26/2015   No results found for: HGBA1C       Assessment & Plan:   Problem List Items Addressed This Visit    None    Visit Diagnoses    Hyperlipidemia LDL goal <100    -  Primary   Relevant Orders   Lipid panel   Comprehensive metabolic panel   CK (Creatine Kinase)    Tolerating statin, encouraged heart healthy diet, avoid trans fats, minimize simple carbs and saturated fats. Increase exercise as tolerated  I am having Marcheta Grammes maintain his benzonatate, fluticasone, doxycycline, and atorvastatin.  No orders of the defined types were placed in this encounter.   CMA served as Education administrator during this visit. History, Physical and Plan performed by medical provider. Documentation and orders reviewed and attested to.  Ann Held, DO

## 2017-09-07 NOTE — Patient Instructions (Signed)

## 2017-09-08 LAB — COMPREHENSIVE METABOLIC PANEL
ALBUMIN: 4.7 g/dL (ref 3.5–5.2)
ALK PHOS: 37 U/L — AB (ref 39–117)
ALT: 31 U/L (ref 0–53)
AST: 17 U/L (ref 0–37)
BUN: 10 mg/dL (ref 6–23)
CALCIUM: 10.2 mg/dL (ref 8.4–10.5)
CHLORIDE: 99 meq/L (ref 96–112)
CO2: 30 mEq/L (ref 19–32)
Creatinine, Ser: 0.94 mg/dL (ref 0.40–1.50)
GFR: 112.58 mL/min (ref >60.00)
Glucose, Bld: 84 mg/dL (ref 70–99)
POTASSIUM: 4 meq/L (ref 3.5–5.1)
Sodium: 138 mEq/L (ref 135–145)
TOTAL PROTEIN: 7.4 g/dL (ref 6.0–8.3)
Total Bilirubin: 1 mg/dL (ref 0.2–1.2)

## 2017-09-08 LAB — LIPID PANEL
Cholesterol: 193 mg/dL (ref 0–200)
HDL: 36.6 mg/dL — ABNORMAL LOW (ref >39.00)
LDL Cholesterol: 121 mg/dL — ABNORMAL HIGH (ref 0–99)
NONHDL: 156.44
Total CHOL/HDL Ratio: 5
Triglycerides: 177 mg/dL — ABNORMAL HIGH (ref 0.0–149.0)
VLDL: 35.4 mg/dL (ref 0.0–40.0)

## 2017-09-08 LAB — CK: Total CK: 127 U/L (ref 7–232)

## 2017-09-14 ENCOUNTER — Other Ambulatory Visit: Payer: Self-pay

## 2017-09-14 DIAGNOSIS — E785 Hyperlipidemia, unspecified: Secondary | ICD-10-CM

## 2017-09-14 MED ORDER — ATORVASTATIN CALCIUM 40 MG PO TABS: 40.0000 mg | ORAL_TABLET | Freq: Every day | ORAL | 2 refills | Status: DC

## 2017-11-13 ENCOUNTER — Other Ambulatory Visit: Payer: Self-pay | Admitting: Family Medicine

## 2017-11-22 ENCOUNTER — Other Ambulatory Visit: Payer: Self-pay | Admitting: *Deleted

## 2017-11-22 MED ORDER — ATORVASTATIN CALCIUM 40 MG PO TABS: 40.0000 mg | ORAL_TABLET | Freq: Every day | ORAL | 0 refills | Status: DC

## 2017-11-23 ENCOUNTER — Encounter: Payer: Self-pay | Admitting: Family Medicine

## 2017-11-23 ENCOUNTER — Ambulatory Visit (INDEPENDENT_AMBULATORY_CARE_PROVIDER_SITE_OTHER): Payer: Managed Care, Other (non HMO) | Admitting: Family Medicine

## 2017-11-23 VITALS — BP 112/64 | HR 90 | Temp 98.6°F | Resp 16 | Ht 70.5 in | Wt 238.4 lb

## 2017-11-23 DIAGNOSIS — Z1211 Encounter for screening for malignant neoplasm of colon: Secondary | ICD-10-CM

## 2017-11-23 DIAGNOSIS — Z23 Encounter for immunization: Secondary | ICD-10-CM

## 2017-11-23 DIAGNOSIS — E785 Hyperlipidemia, unspecified: Secondary | ICD-10-CM | POA: Diagnosis not present

## 2017-11-23 DIAGNOSIS — Z0000 Encounter for general adult medical examination without abnormal findings: Secondary | ICD-10-CM

## 2017-11-23 DIAGNOSIS — Z125 Encounter for screening for malignant neoplasm of prostate: Secondary | ICD-10-CM

## 2017-11-23 LAB — POC HEMOCCULT BLD/STL (OFFICE/1-CARD/DIAGNOSTIC)
Card #1 Date: 8062019
Fecal Occult Blood, POC: NEGATIVE

## 2017-11-23 NOTE — Patient Instructions (Signed)
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
## 2017-11-24 LAB — COMPREHENSIVE METABOLIC PANEL
ALK PHOS: 37 U/L — AB (ref 39–117)
ALT: 36 U/L (ref 0–53)
AST: 18 U/L (ref 0–37)
Albumin: 4.8 g/dL (ref 3.5–5.2)
BILIRUBIN TOTAL: 0.7 mg/dL (ref 0.2–1.2)
BUN: 13 mg/dL (ref 6–23)
CO2: 29 mEq/L (ref 19–32)
CREATININE: 0.95 mg/dL (ref 0.40–1.50)
Calcium: 10.3 mg/dL (ref 8.4–10.5)
Chloride: 103 mEq/L (ref 96–112)
GFR: 111.1 mL/min (ref >60.00)
GLUCOSE: 95 mg/dL (ref 70–99)
Potassium: 3.8 mEq/L (ref 3.5–5.1)
Sodium: 142 mEq/L (ref 135–145)
Total Protein: 7.5 g/dL (ref 6.0–8.3)

## 2017-11-24 LAB — CBC WITH DIFFERENTIAL/PLATELET
BASOS ABS: 0 10*3/uL (ref 0.0–0.1)
Basophils Relative: 0.7 % (ref 0.0–3.0)
EOS ABS: 0.2 10*3/uL (ref 0.0–0.7)
Eosinophils Relative: 3.1 % (ref 0.0–5.0)
HCT: 44.9 % (ref 39.0–52.0)
Hemoglobin: 15.4 g/dL (ref 13.0–17.0)
LYMPHS ABS: 1.9 10*3/uL (ref 0.7–4.0)
Lymphocytes Relative: 32.7 % (ref 12.0–46.0)
MCHC: 34.3 g/dL (ref 30.0–36.0)
MCV: 88.6 fl (ref 78.0–100.0)
MONOS PCT: 11.2 % (ref 3.0–12.0)
Monocytes Absolute: 0.6 10*3/uL (ref 0.1–1.0)
NEUTROS PCT: 52.3 % (ref 43.0–77.0)
Neutro Abs: 3 10*3/uL (ref 1.4–7.7)
Platelets: 266 10*3/uL (ref 150.0–400.0)
RBC: 5.06 Mil/uL (ref 4.22–5.81)
RDW: 13.1 % (ref 11.5–15.5)
WBC: 5.8 10*3/uL (ref 4.0–10.5)

## 2017-11-24 LAB — TSH: TSH: 1.6 u[IU]/mL (ref 0.35–4.50)

## 2017-11-24 LAB — LIPID PANEL
Cholesterol: 207 mg/dL — ABNORMAL HIGH (ref 0–200)
HDL: 35.4 mg/dL — ABNORMAL LOW (ref >39.00)
NONHDL: 171.49
Total CHOL/HDL Ratio: 6
Triglycerides: 202 mg/dL — ABNORMAL HIGH (ref 0.0–149.0)
VLDL: 40.4 mg/dL — ABNORMAL HIGH (ref 0.0–40.0)

## 2017-11-24 LAB — PSA: PSA: 0.89 ng/mL (ref 0.10–4.00)

## 2017-11-24 LAB — LDL CHOLESTEROL, DIRECT: LDL DIRECT: 154 mg/dL

## 2017-11-25 DIAGNOSIS — Z0000 Encounter for general adult medical examination without abnormal findings: Secondary | ICD-10-CM | POA: Insufficient documentation

## 2017-11-25 NOTE — Assessment & Plan Note (Signed)
ghm utd Check labs See AVS 

## 2017-11-26 MED ORDER — FENOFIBRATE 160 MG PO TABS: 160.0000 mg | ORAL_TABLET | Freq: Every day | ORAL | 2 refills | Status: DC

## 2017-11-26 NOTE — Addendum Note (Signed)
Addended byDamita Dunnings D on: 11/26/2017 08:14 AM   Modules accepted: Orders

## 2018-04-01 ENCOUNTER — Other Ambulatory Visit: Payer: Self-pay | Admitting: Family Medicine

## 2018-11-29 ENCOUNTER — Other Ambulatory Visit: Payer: Self-pay

## 2018-11-29 ENCOUNTER — Encounter: Payer: Self-pay | Admitting: Family Medicine

## 2018-11-29 ENCOUNTER — Ambulatory Visit (INDEPENDENT_AMBULATORY_CARE_PROVIDER_SITE_OTHER): Payer: Managed Care, Other (non HMO) | Admitting: Family Medicine

## 2018-11-29 VITALS — BP 137/86 | HR 97 | Temp 97.4°F | Resp 18 | Ht 70.5 in | Wt 254.4 lb

## 2018-11-29 DIAGNOSIS — E785 Hyperlipidemia, unspecified: Secondary | ICD-10-CM | POA: Diagnosis not present

## 2018-11-29 DIAGNOSIS — D2210 Melanocytic nevi of unspecified eyelid, including canthus: Secondary | ICD-10-CM

## 2018-11-29 DIAGNOSIS — R5383 Other fatigue: Secondary | ICD-10-CM

## 2018-11-29 DIAGNOSIS — Z0000 Encounter for general adult medical examination without abnormal findings: Secondary | ICD-10-CM

## 2018-11-29 LAB — LIPID PANEL
Cholesterol: 285 mg/dL — ABNORMAL HIGH (ref 0–200)
HDL: 36.9 mg/dL — ABNORMAL LOW (ref >39.00)
NonHDL: 248
Total CHOL/HDL Ratio: 8
Triglycerides: 371 mg/dL — ABNORMAL HIGH (ref 0.0–149.0)
VLDL: 74.2 mg/dL — ABNORMAL HIGH (ref 0.0–40.0)

## 2018-11-29 LAB — COMPREHENSIVE METABOLIC PANEL
ALT: 85 U/L — ABNORMAL HIGH (ref 0–53)
AST: 35 U/L (ref 0–37)
Albumin: 4.8 g/dL (ref 3.5–5.2)
Alkaline Phosphatase: 51 U/L (ref 39–117)
BUN: 12 mg/dL (ref 6–23)
CO2: 29 mEq/L (ref 19–32)
Calcium: 10.1 mg/dL (ref 8.4–10.5)
Chloride: 100 mEq/L (ref 96–112)
Creatinine, Ser: 1.18 mg/dL (ref 0.40–1.50)
GFR: 81.02 mL/min (ref >60.00)
Glucose, Bld: 130 mg/dL — ABNORMAL HIGH (ref 70–99)
Potassium: 3.8 mEq/L (ref 3.5–5.1)
Sodium: 138 mEq/L (ref 135–145)
Total Bilirubin: 0.4 mg/dL (ref 0.2–1.2)
Total Protein: 7.5 g/dL (ref 6.0–8.3)

## 2018-11-29 LAB — PSA: PSA: 0.9 ng/mL (ref 0.10–4.00)

## 2018-11-29 LAB — CBC WITH DIFFERENTIAL/PLATELET
Basophils Absolute: 0 10*3/uL (ref 0.0–0.1)
Basophils Relative: 0.4 % (ref 0.0–3.0)
Eosinophils Absolute: 0.2 10*3/uL (ref 0.0–0.7)
Eosinophils Relative: 3.9 % (ref 0.0–5.0)
HCT: 46.1 % (ref 39.0–52.0)
Hemoglobin: 15.6 g/dL (ref 13.0–17.0)
Lymphocytes Relative: 24.7 % (ref 12.0–46.0)
Lymphs Abs: 1.4 10*3/uL (ref 0.7–4.0)
MCHC: 33.9 g/dL (ref 30.0–36.0)
MCV: 88.9 fl (ref 78.0–100.0)
Monocytes Absolute: 0.9 10*3/uL (ref 0.1–1.0)
Monocytes Relative: 15.4 % — ABNORMAL HIGH (ref 3.0–12.0)
Neutro Abs: 3.2 10*3/uL (ref 1.4–7.7)
Neutrophils Relative %: 55.6 % (ref 43.0–77.0)
Platelets: 254 10*3/uL (ref 150.0–400.0)
RBC: 5.18 Mil/uL (ref 4.22–5.81)
RDW: 13.4 % (ref 11.5–15.5)
WBC: 5.8 10*3/uL (ref 4.0–10.5)

## 2018-11-29 LAB — TSH: TSH: 1.45 u[IU]/mL (ref 0.35–4.50)

## 2018-11-29 LAB — LDL CHOLESTEROL, DIRECT: Direct LDL: 213 mg/dL
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
## 2018-11-29 NOTE — Assessment & Plan Note (Signed)
ghm utd check

## 2018-11-29 NOTE — Patient Instructions (Signed)
Preventive Care 19-44 Years Old, Male Preventive care refers to lifestyle choices and visits with your health care provider that can promote health and wellness. This includes:  A yearly physical exam. This is also called an annual well check.  Regular dental and eye exams.  Immunizations.  Screening for certain conditions.  Healthy lifestyle choices, such as eating a healthy diet, getting regular exercise, not using drugs or products that contain nicotine and tobacco, and limiting alcohol use. What can I expect for my preventive care visit? Physical exam Your health care provider will check:  Height and weight. These may be used to calculate body mass index (BMI), which is a measurement that tells if you are at a healthy weight.  Heart rate and blood pressure.  Your skin for abnormal spots. Counseling Your health care provider may ask you questions about:  Alcohol, tobacco, and drug use.  Emotional well-being.  Home and relationship well-being.  Sexual activity.  Eating habits.  Work and work Statistician. What immunizations do I need?  Influenza (flu) vaccine  This is recommended every year. Tetanus, diphtheria, and pertussis (Tdap) vaccine  You may need a Td booster every 10 years. Varicella (chickenpox) vaccine  You may need this vaccine if you have not already been vaccinated. Human papillomavirus (HPV) vaccine  If recommended by your health care provider, you may need three doses over 6 months. Measles, mumps, and rubella (MMR) vaccine  You may need at least one dose of MMR. You may also need a second dose. Meningococcal conjugate (MenACWY) vaccine  One dose is recommended if you are 44-76 years old and a Market researcher living in a residence hall, or if you have one of several medical conditions. You may also need additional booster doses. Pneumococcal conjugate (PCV13) vaccine  You may need this if you have certain conditions and were not  previously vaccinated. Pneumococcal polysaccharide (PPSV23) vaccine  You may need one or two doses if you smoke cigarettes or if you have certain conditions. Hepatitis A vaccine  You may need this if you have certain conditions or if you travel or work in places where you may be exposed to hepatitis A. Hepatitis B vaccine  You may need this if you have certain conditions or if you travel or work in places where you may be exposed to hepatitis B. Haemophilus influenzae type b (Hib) vaccine  You may need this if you have certain risk factors. You may receive vaccines as individual doses or as more than one vaccine together in one shot (combination vaccines). Talk with your health care provider about the risks and benefits of combination vaccines. What tests do I need? Blood tests  Lipid and cholesterol levels. These may be checked every 5 years starting at age 17.  Hepatitis C test.  Hepatitis B test. Screening   Diabetes screening. This is done by checking your blood sugar (glucose) after you have not eaten for a while (fasting).  Sexually transmitted disease (STD) testing. Talk with your health care provider about your test results, treatment options, and if necessary, the need for more tests. Follow these instructions at home: Eating and drinking   Eat a diet that includes fresh fruits and vegetables, whole grains, lean protein, and low-fat dairy products.  Take vitamin and mineral supplements as recommended by your health care provider.  Do not drink alcohol if your health care provider tells you not to drink.  If you drink alcohol: ? Limit how much you have to 0-2  drinks a day. ? Be aware of how much alcohol is in your drink. In the [place] one drink equals one 12 oz bottle of beer (355 mL), one 5 oz glass of wine (148 mL), or one 1 oz glass of hard liquor (44 mL). Lifestyle  Take daily care of your teeth and gums.  Stay active. Exercise for at least 30 minutes on 5 or  more days each week.  Do not use any products that contain nicotine or tobacco, such as cigarettes, e-cigarettes, and chewing tobacco. If you need help quitting, ask your health care provider.  If you are sexually active, practice safe sex. Use a condom or other form of protection to prevent STIs (sexually transmitted infections). What's next?  Go to your health care provider once a year for a well check visit.  Ask your health care provider how often you should have your eyes and teeth checked.  Stay up to date on all vaccines. This information is not intended to replace advice given to you by your health care provider. Make sure you discuss any questions you have with your health care provider. Document Released: 06/02/2001 Document Revised: 03/31/2018 Document Reviewed: 03/31/2018 Elsevier Patient Education  2020 Elsevier Inc.  

## 2018-11-29 NOTE — Assessment & Plan Note (Signed)
Encouraged heart healthy diet, increase exercise, avoid trans fats, consider a krill oil cap daily 

## 2018-11-30 ENCOUNTER — Other Ambulatory Visit: Payer: Self-pay | Admitting: Family Medicine

## 2018-11-30 DIAGNOSIS — R739 Hyperglycemia, unspecified: Secondary | ICD-10-CM

## 2018-11-30 DIAGNOSIS — E785 Hyperlipidemia, unspecified: Secondary | ICD-10-CM

## 2018-12-02 LAB — TESTOS,TOTAL,FREE AND SHBG (FEMALE)
Free Testosterone: 49.3 pg/mL (ref 35.0–155.0)
Sex Hormone Binding: 25 nmol/L (ref 10–50)
Testosterone, Total, LC-MS-MS: 308 ng/dL (ref 250–1100)

## 2019-04-18 ENCOUNTER — Ambulatory Visit: Payer: Managed Care, Other (non HMO) | Attending: Internal Medicine

## 2019-04-18 DIAGNOSIS — Z20822 Contact with and (suspected) exposure to covid-19: Secondary | ICD-10-CM

## 2019-04-19 LAB — NOVEL CORONAVIRUS, NAA: SARS-CoV-2, NAA: DETECTED — AB

## 2019-04-20 ENCOUNTER — Encounter: Payer: Self-pay | Admitting: Infectious Diseases

## 2019-04-20 NOTE — Progress Notes (Signed)
Message sent to discuss with patient about Covid symptoms and the use of bamlanivimab, a monoclonal antibody infusion for those with mild to moderate Covid symptoms and at a high risk of hospitalization.  Pt is qualified for this infusion at the [facility] infusion center due to BMI>35   Need to clarify symptoms present and duration for eligibility.

## 2019-04-23 ENCOUNTER — Telehealth: Payer: Managed Care, Other (non HMO) | Admitting: Physician Assistant

## 2019-04-23 DIAGNOSIS — J019 Acute sinusitis, unspecified: Secondary | ICD-10-CM | POA: Diagnosis not present

## 2019-04-23 DIAGNOSIS — B9689 Other specified bacterial agents as the cause of diseases classified elsewhere: Secondary | ICD-10-CM | POA: Diagnosis not present

## 2019-04-23 DIAGNOSIS — U071 COVID-19: Secondary | ICD-10-CM | POA: Diagnosis not present

## 2019-04-23 MED ORDER — DOXYCYCLINE HYCLATE 100 MG PO CAPS: 100.0000 mg | ORAL_CAPSULE | Freq: Two times a day (BID) | ORAL | 0 refills | Status: DC

## 2019-04-23 NOTE — Progress Notes (Signed)

## 2019-04-23 NOTE — Progress Notes (Signed)
I have spent 5 minutes in review of e-visit questionnaire, review and updating patient chart, medical decision making and response to patient.   Annalise Mcdiarmid Cody Edynn Gillock, PA-C    

## 2019-05-15 ENCOUNTER — Encounter: Payer: Self-pay | Admitting: Family Medicine

## 2019-05-15 ENCOUNTER — Ambulatory Visit (INDEPENDENT_AMBULATORY_CARE_PROVIDER_SITE_OTHER): Payer: Managed Care, Other (non HMO) | Admitting: Family

## 2019-05-15 ENCOUNTER — Encounter: Payer: Self-pay | Admitting: Family

## 2019-05-15 ENCOUNTER — Other Ambulatory Visit: Payer: Self-pay

## 2019-05-15 DIAGNOSIS — J019 Acute sinusitis, unspecified: Secondary | ICD-10-CM

## 2019-05-15 MED ORDER — FENOFIBRATE 160 MG PO TABS: 160.0000 mg | ORAL_TABLET | Freq: Every day | ORAL | 0 refills | Status: DC

## 2019-05-15 MED ORDER — CEFDINIR 300 MG PO CAPS: 300.0000 mg | ORAL_CAPSULE | Freq: Two times a day (BID) | ORAL | 0 refills | Status: AC

## 2019-05-15 NOTE — Progress Notes (Signed)
Virtual Visit via Telephone Note  I connected with Vincent Pollard on 05/15/19 at 11:40 AM EST by telephone and verified that I am speaking with the correct person using two identifiers.  Location: Patient: home Provider: home   I discussed the limitations, risks, security and privacy concerns of performing an evaluation and management service by telephone and the availability of in person appointments. I also discussed with the patient that there may be a patient responsible charge related to this service. The patient expressed understanding and agreed to proceed.  This is a failed video visit due to technical issues on the provider's end.  History of Present Illness:  Patient is a 44 year old male who presents today with chief complaint of nasal congestion.  He reports that he was initially treated for sinus infection in mid December 2020.  This was treated through an ED visit.  He was given a prescription for antibiotics which he took for 10 days.  He believes he was treated with doxycycline.  He did not note significant improvement following antibiotics.  He does report that he recently switched jobs and has been working outside more and wonders if this may be contributing to his congestion.  He reports associated sinus pressure near his nose on both sides.  Nasal discharge is green.  He has a history of penicillin allergy as a baby.  Past Medical History:  Diagnosis Date  . Hyperlipidemia   . Torn meniscus    right leg     Social History   Socioeconomic History  . Marital status: Married    Spouse name: Not on file  . Number of children: Not on file  . Years of education: Not on file  . Highest education level: Not on file  Occupational History  . Not on file  Tobacco Use  . Smoking status: Never Smoker  . Smokeless tobacco: Never Used  Substance and Sexual Activity  . Alcohol use: No  . Drug use: No  . Sexual activity: Yes    Partners: Female  Other Topics Concern  .  Not on file  Social History Narrative  . Not on file   Social Determinants of Health   Financial Resource Strain:   . Difficulty of Paying Living Expenses: Not on file  Food Insecurity:   . Worried About Charity fundraiser in the Last Year: Not on file  . Ran Out of Food in the Last Year: Not on file  Transportation Needs:   . Lack of Transportation (Medical): Not on file  . Lack of Transportation (Non-Medical): Not on file  Physical Activity:   . Days of Exercise per Week: Not on file  . Minutes of Exercise per Session: Not on file  Stress:   . Feeling of Stress : Not on file  Social Connections:   . Frequency of Communication with Friends and Family: Not on file  . Frequency of Social Gatherings with Friends and Family: Not on file  . Attends Religious Services: Not on file  . Active Member of Clubs or Organizations: Not on file  . Attends Archivist Meetings: Not on file  . Marital Status: Not on file  Intimate Partner Violence:   . Fear of Current or Ex-Partner: Not on file  . Emotionally Abused: Not on file  . Physically Abused: Not on file  . Sexually Abused: Not on file    Past Surgical History:  Procedure Laterality Date  . MASS EXCISION  2007   from the  left thumb  . SHOULDER SURGERY Left 2015   dr Theda Sers    Family History  Problem Relation Age of Onset  . Hypertension Father   . Stroke Maternal Grandfather   . Diabetes Maternal Grandfather   . Heart disease Maternal Grandfather   . Hypertension Mother   . Diabetes Mother   . Diabetes Maternal Grandmother   . AAA (abdominal aortic aneurysm) Maternal Grandmother   . Arthritis Paternal Grandmother   . Arthritis Paternal Grandfather   . Prostate cancer Paternal Grandfather 34  . Hypertension Sister   . Polycystic ovary syndrome Sister   . Prostate cancer Paternal Uncle   . Prostate cancer Paternal Uncle     Allergies  Allergen Reactions  . Penicillins Other (See Comments)    Childhood  reaction unknown    Current Outpatient Medications on File Prior to Visit  Medication Sig Dispense Refill  . atorvastatin (LIPITOR) 40 MG tablet Take 1 tablet (40 mg total) by mouth daily. 90 tablet 0  . nabumetone (RELAFEN) 750 MG tablet Take 750 mg by mouth every 12 (twelve) hours.  1  . traMADol (ULTRAM) 50 MG tablet Take 1 tablet by mouth 4 (four) times daily.  0   No current facility-administered medications on file prior to visit.    There were no vitals taken for this visit.     Observations/Objective:   Gen: Awake, alert, no acute distress Resp: Breathing sounds even and non-labored Psych: calm/pleasant demeanor Neuro: Alert and Oriented x 3, speech is clear.   Assessment and Plan:  Sinusitis-we will treat with cefdinir 300 mg twice daily for 10 days.  Hyperlipidemia-he requests a refill on fenofibrate.  He has an upcoming appointment with his PCP in a few weeks.  Refill provided.  Follow Up Instructions:    I discussed the assessment and treatment plan with the patient. The patient was provided an opportunity to ask questions and all were answered. The patient agreed with the plan and demonstrated an understanding of the instructions.   The patient was advised to call back or seek an in-person evaluation if the symptoms worsen or if the condition fails to improve as anticipated.  I provided 15 minutes of non-face-to-face time during this encounter.   Nance Pear, NP

## 2019-05-31 ENCOUNTER — Other Ambulatory Visit: Payer: Self-pay

## 2019-06-01 ENCOUNTER — Ambulatory Visit: Payer: Managed Care, Other (non HMO) | Admitting: Family Medicine

## 2019-06-08 ENCOUNTER — Other Ambulatory Visit: Payer: Self-pay

## 2019-06-08 ENCOUNTER — Ambulatory Visit (INDEPENDENT_AMBULATORY_CARE_PROVIDER_SITE_OTHER): Payer: Managed Care, Other (non HMO) | Admitting: Family Medicine

## 2019-06-08 ENCOUNTER — Encounter: Payer: Self-pay | Admitting: Family Medicine

## 2019-06-08 DIAGNOSIS — E785 Hyperlipidemia, unspecified: Secondary | ICD-10-CM

## 2019-06-08 MED ORDER — FENOFIBRATE 160 MG PO TABS: 160.0000 mg | ORAL_TABLET | Freq: Every day | ORAL | 0 refills | Status: DC

## 2019-06-08 NOTE — Patient Instructions (Signed)
COVID-19 Vaccine Information can be found at: [email or web address] For questions related to vaccine distribution or appointments, please email [email or web address] or call [phone].    

## 2019-06-08 NOTE — Progress Notes (Signed)
Virtual Visit via Video Note  I connected with Vincent Pollard on 06/08/19 at  9:20 AM EST by a video enabled telemedicine application and verified that I am speaking with the correct person using two identifiers.  Location: Patient: home alone Provider: home    I discussed the limitations of evaluation and management by telemedicine and the availability of in person appointments. The patient expressed understanding and agreed to proceed.  History of Present Illness: Pt is home alone with no complaints  Needs f/u lipids     Past Medical History:  Diagnosis Date  . Hyperlipidemia   . Torn meniscus    right leg   No current outpatient medications on file prior to visit.   No current facility-administered medications on file prior to visit.   Allergies  Allergen Reactions  . Penicillins Other (See Comments)    Childhood reaction unknown    Observations/Objective: There were no vitals filed for this visit. Pt in NAD   Assessment and Plan: 1. Hyperlipidemia, unspecified hyperlipidemia type Encouraged heart healthy diet, increase exercise, avoid trans fats, consider a krill oil cap daily con't fenofibrate -- will check labs - Lipid panel; Future - Comprehensive metabolic panel; Future   Follow Up Instructions:    I discussed the assessment and treatment plan with the patient. The patient was provided an opportunity to ask questions and all were answered. The patient agreed with the plan and demonstrated an understanding of the instructions.   The patient was advised to call back or seek an in-person evaluation if the symptoms worsen or if the condition fails to improve as anticipated.  I provided 25 minutes of non-face-to-face time during this encounter.   Ann Held, DO

## 2019-08-09 ENCOUNTER — Other Ambulatory Visit: Payer: Self-pay | Admitting: Family

## 2019-08-09 DIAGNOSIS — E785 Hyperlipidemia, unspecified: Secondary | ICD-10-CM

## 2020-07-22 ENCOUNTER — Other Ambulatory Visit: Payer: Self-pay | Admitting: Family Medicine

## 2020-07-22 DIAGNOSIS — E785 Hyperlipidemia, unspecified: Secondary | ICD-10-CM

## 2020-08-02 ENCOUNTER — Other Ambulatory Visit: Payer: Self-pay | Admitting: Family Medicine

## 2020-08-02 DIAGNOSIS — E785 Hyperlipidemia, unspecified: Secondary | ICD-10-CM

## 2020-09-06 ENCOUNTER — Encounter: Payer: Managed Care, Other (non HMO) | Admitting: Family Medicine

## 2020-09-11 ENCOUNTER — Encounter: Payer: Self-pay | Admitting: Medical

## 2020-09-11 ENCOUNTER — Telehealth: Payer: Self-pay | Admitting: Medical

## 2020-09-11 ENCOUNTER — Ambulatory Visit: Payer: Managed Care, Other (non HMO) | Admitting: Medical

## 2020-09-11 ENCOUNTER — Other Ambulatory Visit: Payer: Self-pay

## 2020-09-11 VITALS — BP 118/78 | HR 89 | Temp 98.7°F | Resp 18 | Ht 70.5 in | Wt 242.2 lb

## 2020-09-11 DIAGNOSIS — R39859 Costovertebral (angle) tenderness, unspecified side: Secondary | ICD-10-CM

## 2020-09-11 DIAGNOSIS — M549 Dorsalgia, unspecified: Secondary | ICD-10-CM | POA: Diagnosis not present

## 2020-09-11 DIAGNOSIS — R319 Hematuria, unspecified: Secondary | ICD-10-CM | POA: Diagnosis not present

## 2020-09-11 DIAGNOSIS — Z125 Encounter for screening for malignant neoplasm of prostate: Secondary | ICD-10-CM

## 2020-09-11 DIAGNOSIS — R1031 Right lower quadrant pain: Secondary | ICD-10-CM | POA: Diagnosis not present

## 2020-09-11 LAB — POC URINALSYSI DIPSTICK (AUTOMATED)
Bilirubin, UA: NEGATIVE
Glucose, UA: NEGATIVE
Ketones, UA: NEGATIVE
Leukocytes, UA: NEGATIVE
Nitrite, UA: NEGATIVE
Protein, UA: POSITIVE — AB
Spec Grav, UA: 1.02 (ref 1.010–1.025)
Urobilinogen, UA: 1 E.U./dL
pH, UA: 6 (ref 5.0–8.0)

## 2020-09-11 MED ORDER — HYDROCODONE-ACETAMINOPHEN 5-325 MG PO TABS: 1.0000 | ORAL_TABLET | Freq: Four times a day (QID) | ORAL | 0 refills | Status: DC | PRN

## 2020-09-11 NOTE — Progress Notes (Signed)
Subjective:    Patient ID: Vincent Pollard, male    DOB: Jul 06, 1975, 45 y.o.   MRN: [id number]  HPI  Pt in with some on and off rt groin pain which he associates with some rt lower back/cva area pain. Pain will occur couple of times a month. Pain will occur intermittetn. When has pain is 3/10. But one time had pain high level that woke him up out of sleep.   Yesterday urine looked little dark. Looked  pinkish red after he urinated.  Last time had pain was yesterday and lasted for about one hour.  6 month ago severe rt cva pain at night.   Pt is using ibuprofen for pain.      Review of Systems  Constitutional: Negative for chills and fatigue.  Respiratory: Negative for cough, chest tightness, shortness of breath and wheezing.   Cardiovascular: Negative for chest pain and palpitations.  Gastrointestinal: Negative for abdominal pain, diarrhea, nausea and vomiting.  Endocrine: Negative for polydipsia, polyphagia and polyuria.  Genitourinary: Negative for dysuria, frequency, penile pain, scrotal swelling and urgency.  Musculoskeletal: Negative for back pain.  Skin: Negative for rash.  Neurological: Negative for dizziness, seizures, speech difficulty, weakness and headaches.  Hematological: Negative for adenopathy. Does not bruise/bleed easily.  Psychiatric/Behavioral: Negative for behavioral problems.    Past Medical History:  Diagnosis Date  . Hyperlipidemia   . Torn meniscus    right leg     Social History   Socioeconomic History  . Marital status: Married    Spouse name: Not on file  . Number of children: Not on file  . Years of education: Not on file  . Highest education level: Not on file  Occupational History  . Not on file  Tobacco Use  . Smoking status: Never Smoker  . Smokeless tobacco: Never Used  Substance and Sexual Activity  . Alcohol use: No  . Drug use: No  . Sexual activity: Yes    Partners: Female  Other Topics Concern  . Not on file  Social  History Narrative  . Not on file   Social Determinants of Health   Financial Resource Strain: Not on file  Food Insecurity: Not on file  Transportation Needs: Not on file  Physical Activity: Not on file  Stress: Not on file  Social Connections: Not on file  Intimate Partner Violence: Not on file    Past Surgical History:  Procedure Laterality Date  . MASS EXCISION  2007   from the left thumb  . SHOULDER SURGERY Left 2015   dr Theda Sers    Family History  Problem Relation Age of Onset  . Hypertension Father   . Stroke Maternal Grandfather   . Diabetes Maternal Grandfather   . Heart disease Maternal Grandfather   . Hypertension Mother   . Diabetes Mother   . Diabetes Maternal Grandmother   . AAA (abdominal aortic aneurysm) Maternal Grandmother   . Arthritis Paternal Grandmother   . Arthritis Paternal Grandfather   . Prostate cancer Paternal Grandfather 51  . Hypertension Sister   . Polycystic ovary syndrome Sister   . Prostate cancer Paternal Uncle   . Prostate cancer Paternal Uncle     Allergies  Allergen Reactions  . Penicillins Other (See Comments)    Childhood reaction unknown    Current Outpatient Medications on File Prior to Visit  Medication Sig Dispense Refill  . fenofibrate 160 MG tablet Take 1 tablet (160 mg total) by mouth daily. 90 tablet 1  No current facility-administered medications on file prior to visit.    BP 118/78 (BP Location: Right Arm, Patient Position: Sitting, Cuff Size: Large)   Pulse 89   Temp 98.7 F (37.1 C) (Oral)   Resp 18   Ht 5' 10.5" (1.791 m)   Wt 242 lb 3.2 oz (109.9 kg)   SpO2 97%   BMI 34.26 kg/m       Objective:   Physical Exam  General Mental Status- Alert. General Appearance- Not in acute distress.     Neck Carotid Arteries- Normal color. Moisture- Normal Moisture. No carotid bruits. No JVD.  Chest and Lung Exam Auscultation: Breath Sounds:-Normal.  Cardiovascular Auscultation:Rythm-  Regular. Murmurs & Other Heart Sounds:Auscultation of the heart reveals- No Murmurs.  Abdomen Inspection:-Inspeection Normal. Palpation/Percussion:Note:No mass. Palpation and Percussion of the abdomen reveal- Non Tender, Non Distended + BS, no rebound or guarding.  Neurologic Cranial Nerve exam:- CN III-XII intact(No nystagmus), symmetric smile. Strength:- 5/5 equal and symmetric strength both upper and lower extremities.  Back- no cva area presenlty.  Skin- no rash on back or abdomen.  gential- no testicle tenderness, no hernia felt on exam digital exam of inguinal canals.     Assessment & Plan:  History of right CVA pain, right flank pain and recent hematuria.  Occasional pain radiating towards the groin as well.  Last pain event was yesterday.  Urine did show large blood, protein and cloudy appearance.  We will send urine out for culture.  Will get CBC and PSA lab work today.  Placed order for CT renal stone protocol.  Possible for intermittent smaller size stones.  Stay well-hydrated and can use low-dose ibuprofen for pain.  If any severe pain/renal colic like then making Norco 5/325 mg tabs available.  Follow-up in 7 to 10 days or as needed.  Mackie Pai, PA-C

## 2020-09-11 NOTE — Patient Instructions (Addendum)
History of right CVA pain, right flank pain and recent hematuria.  Occasional pain radiating towards the groin as well.  Last pain event was yesterday.  Urine did show large blood, protein and cloudy appearance.  We will send urine out for culture.  Will get CBC and PSA lab work today.  Placed order for CT renal stone protocol.  Possible for intermittent smaller size stones.  Stay well-hydrated and can use low-dose ibuprofen for pain.  If any severe pain/renal colic like then making Norco 5/325 mg tabs available.  Follow-up in 7 to 10 days or as needed.

## 2020-09-11 NOTE — Telephone Encounter (Signed)
[facility]. Will get piror auth for ct renal stone protocol. Can you get him scheduled tomorrow or latest early Friday morning.

## 2020-09-12 LAB — CBC WITH DIFFERENTIAL/PLATELET
Basophils Absolute: 0 10*3/uL (ref 0.0–0.1)
Basophils Relative: 0.8 % (ref 0.0–3.0)
Eosinophils Absolute: 0.2 10*3/uL (ref 0.0–0.7)
Eosinophils Relative: 3.9 % (ref 0.0–5.0)
HCT: 44.9 % (ref 39.0–52.0)
Hemoglobin: 15.4 g/dL (ref 13.0–17.0)
Lymphocytes Relative: 27.2 % (ref 12.0–46.0)
Lymphs Abs: 1.7 10*3/uL (ref 0.7–4.0)
MCHC: 34.2 g/dL (ref 30.0–36.0)
MCV: 88.6 fl (ref 78.0–100.0)
Monocytes Absolute: 0.6 10*3/uL (ref 0.1–1.0)
Monocytes Relative: 10.3 % (ref 3.0–12.0)
Neutro Abs: 3.6 10*3/uL (ref 1.4–7.7)
Neutrophils Relative %: 57.8 % (ref 43.0–77.0)
Platelets: 273 10*3/uL (ref 150.0–400.0)
RBC: 5.07 Mil/uL (ref 4.22–5.81)
RDW: 13.1 % (ref 11.5–15.5)
WBC: 6.2 10*3/uL (ref 4.0–10.5)

## 2020-09-12 LAB — COMPREHENSIVE METABOLIC PANEL
ALT: 25 U/L (ref 0–53)
AST: 16 U/L (ref 0–37)
Albumin: 4.8 g/dL (ref 3.5–5.2)
Alkaline Phosphatase: 38 U/L — ABNORMAL LOW (ref 39–117)
BUN: 14 mg/dL (ref 6–23)
CO2: 28 mEq/L (ref 19–32)
Calcium: 10.3 mg/dL (ref 8.4–10.5)
Chloride: 101 mEq/L (ref 96–112)
Creatinine, Ser: 1.1 mg/dL (ref 0.40–1.50)
GFR: 80.72 mL/min (ref >60.00)
Glucose, Bld: 123 mg/dL — ABNORMAL HIGH (ref 70–99)
Potassium: 4 mEq/L (ref 3.5–5.1)
Sodium: 138 mEq/L (ref 135–145)
Total Bilirubin: 0.5 mg/dL (ref 0.2–1.2)
Total Protein: 7.4 g/dL (ref 6.0–8.3)

## 2020-09-12 LAB — URINE CULTURE
MICRO NUMBER:: 11933673
Result:: NO GROWTH
SPECIMEN QUALITY:: ADEQUATE

## 2020-09-23 ENCOUNTER — Encounter: Payer: Self-pay | Admitting: Medical

## 2020-09-23 ENCOUNTER — Ambulatory Visit (HOSPITAL_BASED_OUTPATIENT_CLINIC_OR_DEPARTMENT_OTHER)
Admission: RE | Admit: 2020-09-23 | Discharge: 2020-09-23 | Disposition: A | Discharge status: Home / Self Care | Payer: Managed Care, Other (non HMO) | Source: Ambulatory Visit | Attending: Medical | Admitting: Medical

## 2020-09-23 ENCOUNTER — Other Ambulatory Visit: Payer: Self-pay

## 2020-09-23 DIAGNOSIS — R1031 Right lower quadrant pain: Secondary | ICD-10-CM

## 2020-09-23 DIAGNOSIS — R319 Hematuria, unspecified: Secondary | ICD-10-CM

## 2020-09-23 DIAGNOSIS — M549 Dorsalgia, unspecified: Secondary | ICD-10-CM

## 2020-09-23 DIAGNOSIS — R39859 Costovertebral (angle) tenderness, unspecified side: Secondary | ICD-10-CM

## 2020-09-24 ENCOUNTER — Encounter: Payer: Self-pay | Admitting: Family Medicine

## 2020-09-24 ENCOUNTER — Ambulatory Visit (INDEPENDENT_AMBULATORY_CARE_PROVIDER_SITE_OTHER): Payer: Managed Care, Other (non HMO) | Admitting: Family Medicine

## 2020-09-24 VITALS — BP 110/60 | HR 90 | Temp 98.7°F | Resp 18 | Ht 70.5 in | Wt 245.2 lb

## 2020-09-24 DIAGNOSIS — Z1211 Encounter for screening for malignant neoplasm of colon: Secondary | ICD-10-CM | POA: Diagnosis not present

## 2020-09-24 DIAGNOSIS — Z0000 Encounter for general adult medical examination without abnormal findings: Secondary | ICD-10-CM

## 2020-09-24 DIAGNOSIS — Z1159 Encounter for screening for other viral diseases: Secondary | ICD-10-CM

## 2020-09-24 DIAGNOSIS — E785 Hyperlipidemia, unspecified: Secondary | ICD-10-CM | POA: Diagnosis not present

## 2020-09-24 NOTE — Assessment & Plan Note (Signed)
ghm utd Check labs  

## 2020-09-24 NOTE — Assessment & Plan Note (Signed)
Encouraged heart healthy diet, increase exercise, avoid trans fats, consider a krill oil cap daily 

## 2020-09-24 NOTE — Progress Notes (Signed)
Subjective:   By signing my name below, I, Vincent Pollard, attest that this documentation has been prepared under the direction and in the presence of Dr. Roma Schanz, DO. 09/24/2020      Patient ID: Vincent Pollard, male    DOB: Mar 30, 1976, 45 y.o.   MRN: [id number]  Chief Complaint  Patient presents with  . Annual Exam    Pt states fasting     HPI Patient is in today for a comprehensive physical exam.  He complains of having more acid reflux recently. He does not control his diet at this time. He has kidney stones and does not know if he has passed them yet. He has occasional right side pain and he does not have any pain at this time. He was prescribed hydrocodone to help manage his pain.  He continues taking 160 mg fenofibrate daily PO. He denies having any skin changes at this time. He reports that he has a family history of prostate cancer. He is due for vision care. He was prescribed glasses but does not wear them. He is UTD on dental care. He does not have the Covid-19 vaccine at this time and is interested in getting it because his daughter must have for a school requirement.    Past Medical History:  Diagnosis Date  . Hyperlipidemia   . Torn meniscus    right leg    Past Surgical History:  Procedure Laterality Date  . MASS EXCISION  2007   from the left thumb  . SHOULDER SURGERY Left 2015   dr Theda Sers    Family History  Problem Relation Age of Onset  . Hypertension Father   . Prostate cancer Father   . Stroke Maternal Grandfather   . Diabetes Maternal Grandfather   . Heart disease Maternal Grandfather   . Hypertension Mother   . Diabetes Mother   . Diabetes Maternal Grandmother   . AAA (abdominal aortic aneurysm) Maternal Grandmother   . Arthritis Paternal Grandmother   . Arthritis Paternal Grandfather   . Prostate cancer Paternal Grandfather 51  . Hypertension Sister   . Polycystic ovary syndrome Sister   . Prostate cancer Paternal Uncle   .  Prostate cancer Paternal Uncle     Social History   Socioeconomic History  . Marital status: Married    Spouse name: Not on file  . Number of children: Not on file  . Years of education: Not on file  . Highest education level: Not on file  Occupational History  . Occupation: self employed    Comment: auto detail   Tobacco Use  . Smoking status: Never Smoker  . Smokeless tobacco: Never Used  Substance and Sexual Activity  . Alcohol use: No  . Drug use: No  . Sexual activity: Yes    Partners: Female  Other Topics Concern  . Not on file  Social History Narrative  . Not on file   Social Determinants of Health   Financial Resource Strain: Not on file  Food Insecurity: Not on file  Transportation Needs: Not on file  Physical Activity: Not on file  Stress: Not on file  Social Connections: Not on file  Intimate Partner Violence: Not on file    Outpatient Medications Prior to Visit  Medication Sig Dispense Refill  . fenofibrate 160 MG tablet Take 1 tablet (160 mg total) by mouth daily. 90 tablet 1  . HYDROcodone-acetaminophen (NORCO) 5-325 MG tablet Take 1 tablet by mouth every 6 (six) hours as needed  for moderate pain or severe pain. (Patient not taking: Reported on 09/24/2020) 6 tablet 0   No facility-administered medications prior to visit.    Allergies  Allergen Reactions  . Penicillins Other (See Comments)    Childhood reaction unknown    Review of Systems  Constitutional: Negative for chills, fever and malaise/fatigue.  HENT: Negative for congestion and hearing loss.   Eyes: Negative for blurred vision and discharge.  Respiratory: Negative for cough, sputum production and shortness of breath.   Cardiovascular: Negative for chest pain, palpitations and leg swelling.  Gastrointestinal: Negative for abdominal pain, blood in stool, constipation, diarrhea, heartburn, nausea and vomiting.  Genitourinary: Positive for flank pain (Right side occasional pain). Negative  for dysuria, frequency, hematuria and urgency.  Musculoskeletal: Negative for back pain, falls and myalgias.  Skin: Negative for rash.       (-)Skin changes  Neurological: Negative for dizziness, sensory change, loss of consciousness, weakness and headaches.  Endo/Heme/Allergies: Negative for environmental allergies. Does not bruise/bleed easily.  Psychiatric/Behavioral: Negative for depression and suicidal ideas. The patient is not nervous/anxious and does not have insomnia.        Objective:    Physical Exam Vitals and nursing note reviewed.  Constitutional:      General: He is not in acute distress.    Appearance: Normal appearance. He is not ill-appearing.  HENT:     Head: Normocephalic and atraumatic.     Right Ear: Tympanic membrane and external ear normal.     Left Ear: Tympanic membrane and external ear normal.  Eyes:     Extraocular Movements: Extraocular movements intact.     Pupils: Pupils are equal, round, and reactive to light.  Cardiovascular:     Rate and Rhythm: Normal rate and regular rhythm.     Pulses: Normal pulses.     Heart sounds: Normal heart sounds. No murmur heard. No gallop.   Pulmonary:     Effort: Pulmonary effort is normal. No respiratory distress.     Breath sounds: Normal breath sounds. No wheezing, rhonchi or rales.  Abdominal:     General: Bowel sounds are normal. There is no distension.     Palpations: Abdomen is soft. There is no mass.     Tenderness: There is no abdominal tenderness. There is no guarding or rebound.     Hernia: No hernia is present.  Skin:    General: Skin is warm and dry.  Neurological:     Mental Status: He is alert and oriented to person, place, and time.  Psychiatric:        Behavior: Behavior normal.     BP 110/60 (BP Location: Right Arm, Patient Position: Sitting, Cuff Size: Large)   Pulse 90   Temp 98.7 F (37.1 C) (Oral)   Resp 18   Ht 5' 10.5" (1.791 m)   Wt 245 lb 3.2 oz (111.2 kg)   SpO2 96%   BMI  34.69 kg/m  Wt Readings from Last 3 Encounters:  09/24/20 245 lb 3.2 oz (111.2 kg)  09/11/20 242 lb 3.2 oz (109.9 kg)  11/29/18 254 lb 6.4 oz (115.4 kg)    Diabetic Foot Exam - Simple   No data filed    Lab Results  Component Value Date   WBC 6.2 09/11/2020   HGB 15.4 09/11/2020   HCT 44.9 09/11/2020   PLT 273.0 09/11/2020   GLUCOSE 123 (H) 09/11/2020   CHOL 285 (H) 11/29/2018   TRIG 371.0 (H) 11/29/2018   HDL  36.90 (L) 11/29/2018   LDLDIRECT 213.0 11/29/2018   LDLCALC 121 (H) 09/07/2017   ALT 25 09/11/2020   AST 16 09/11/2020   NA 138 09/11/2020   K 4.0 09/11/2020   CL 101 09/11/2020   CREATININE 1.10 09/11/2020   BUN 14 09/11/2020   CO2 28 09/11/2020   TSH 1.45 11/29/2018   PSA 0.90 11/29/2018    Lab Results  Component Value Date   TSH 1.45 11/29/2018   Lab Results  Component Value Date   WBC 6.2 09/11/2020   HGB 15.4 09/11/2020   HCT 44.9 09/11/2020   MCV 88.6 09/11/2020   PLT 273.0 09/11/2020   Lab Results  Component Value Date   NA 138 09/11/2020   K 4.0 09/11/2020   CO2 28 09/11/2020   GLUCOSE 123 (H) 09/11/2020   BUN 14 09/11/2020   CREATININE 1.10 09/11/2020   BILITOT 0.5 09/11/2020   ALKPHOS 38 (L) 09/11/2020   AST 16 09/11/2020   ALT 25 09/11/2020   PROT 7.4 09/11/2020   ALBUMIN 4.8 09/11/2020   CALCIUM 10.3 09/11/2020   GFR 80.72 09/11/2020   Lab Results  Component Value Date   CHOL 285 (H) 11/29/2018   Lab Results  Component Value Date   HDL 36.90 (L) 11/29/2018   Lab Results  Component Value Date   LDLCALC 121 (H) 09/07/2017   Lab Results  Component Value Date   TRIG 371.0 (H) 11/29/2018   Lab Results  Component Value Date   CHOLHDL 8 11/29/2018   No results found for: HGBA1C  Colonoscopy- Due. PSA- Last completed 11/29/2018. Results normal.     Assessment & Plan:   Problem List Items Addressed This Visit      Unprioritized   Hyperlipidemia    Encouraged heart healthy diet, increase exercise, avoid trans  fats, consider a krill oil cap daily      Preventative health care - Primary    ghm utd Check labs        Relevant Orders   CBC with Differential/Platelet   Comprehensive metabolic panel   Lipid panel   PSA   TSH    Other Visit Diagnoses    Colon cancer screening       Relevant Orders   Ambulatory referral to Gastroenterology   Need for hepatitis C screening test       Relevant Orders   Hepatitis C antibody       No orders of the defined types were placed in this encounter.   I, Dr. Roma Schanz, DO, personally preformed the services described in this documentation.  All medical record entries made by the scribe were at my direction and in my presence.  I have reviewed the chart and discharge instructions (if applicable) and agree that the record reflects my personal performance and is accurate and complete. 09/24/2020   I,Vincent Pollard,acting as a scribe for Ann Held, DO.,have documented all relevant documentation on the behalf of Ann Held, DO,as directed by  Ann Held, DO while in the presence of Ann Held, DO.   Ann Held, DO

## 2020-09-24 NOTE — Patient Instructions (Signed)

## 2020-10-04 ENCOUNTER — Other Ambulatory Visit: Payer: Self-pay

## 2020-10-04 ENCOUNTER — Other Ambulatory Visit (INDEPENDENT_AMBULATORY_CARE_PROVIDER_SITE_OTHER): Payer: Managed Care, Other (non HMO)

## 2020-10-04 DIAGNOSIS — Z0000 Encounter for general adult medical examination without abnormal findings: Secondary | ICD-10-CM | POA: Diagnosis not present

## 2020-10-04 DIAGNOSIS — Z125 Encounter for screening for malignant neoplasm of prostate: Secondary | ICD-10-CM | POA: Diagnosis not present

## 2020-10-04 DIAGNOSIS — Z1159 Encounter for screening for other viral diseases: Secondary | ICD-10-CM

## 2020-10-04 LAB — COMPREHENSIVE METABOLIC PANEL
ALT: 27 U/L (ref 0–53)
AST: 17 U/L (ref 0–37)
Albumin: 4.8 g/dL (ref 3.5–5.2)
Alkaline Phosphatase: 34 U/L — ABNORMAL LOW (ref 39–117)
BUN: 13 mg/dL (ref 6–23)
CO2: 28 mEq/L (ref 19–32)
Calcium: 10.1 mg/dL (ref 8.4–10.5)
Chloride: 103 mEq/L (ref 96–112)
Creatinine, Ser: 1.13 mg/dL (ref 0.40–1.50)
GFR: 78.12 mL/min (ref >60.00)
Glucose, Bld: 116 mg/dL — ABNORMAL HIGH (ref 70–99)
Potassium: 4.4 mEq/L (ref 3.5–5.1)
Sodium: 139 mEq/L (ref 135–145)
Total Bilirubin: 0.6 mg/dL (ref 0.2–1.2)
Total Protein: 7.5 g/dL (ref 6.0–8.3)

## 2020-10-04 LAB — LIPID PANEL
Cholesterol: 285 mg/dL — ABNORMAL HIGH (ref 0–200)
HDL: 42.6 mg/dL (ref >39.00)
LDL Cholesterol: 217 mg/dL — ABNORMAL HIGH (ref 0–99)
NonHDL: 242.22
Total CHOL/HDL Ratio: 7
Triglycerides: 125 mg/dL (ref 0.0–149.0)
VLDL: 25 mg/dL (ref 0.0–40.0)

## 2020-10-04 LAB — CBC WITH DIFFERENTIAL/PLATELET
Basophils Absolute: 0 10*3/uL (ref 0.0–0.1)
Basophils Relative: 0.4 % (ref 0.0–3.0)
Eosinophils Absolute: 0.2 10*3/uL (ref 0.0–0.7)
Eosinophils Relative: 3.8 % (ref 0.0–5.0)
HCT: 45.5 % (ref 39.0–52.0)
Hemoglobin: 15.3 g/dL (ref 13.0–17.0)
Lymphocytes Relative: 33.4 % (ref 12.0–46.0)
Lymphs Abs: 1.8 10*3/uL (ref 0.7–4.0)
MCHC: 33.6 g/dL (ref 30.0–36.0)
MCV: 88.7 fl (ref 78.0–100.0)
Monocytes Absolute: 0.6 10*3/uL (ref 0.1–1.0)
Monocytes Relative: 11.1 % (ref 3.0–12.0)
Neutro Abs: 2.7 10*3/uL (ref 1.4–7.7)
Neutrophils Relative %: 51.3 % (ref 43.0–77.0)
Platelets: 259 10*3/uL (ref 150.0–400.0)
RBC: 5.13 Mil/uL (ref 4.22–5.81)
RDW: 13.4 % (ref 11.5–15.5)
WBC: 5.3 10*3/uL (ref 4.0–10.5)

## 2020-10-04 LAB — TSH: TSH: 2.35 u[IU]/mL (ref 0.35–4.50)

## 2020-10-04 LAB — PSA: PSA: 1.11 ng/mL (ref 0.10–4.00)

## 2020-10-07 LAB — HEPATITIS C ANTIBODY
Hepatitis C Ab: NONREACTIVE
SIGNAL TO CUT-OFF: 0.08 (ref <1.00)

## 2020-10-09 ENCOUNTER — Other Ambulatory Visit: Payer: Self-pay

## 2020-10-09 DIAGNOSIS — E785 Hyperlipidemia, unspecified: Secondary | ICD-10-CM

## 2020-10-09 MED ORDER — ROSUVASTATIN CALCIUM 10 MG PO TABS: 10.0000 mg | ORAL_TABLET | Freq: Every day | ORAL | 1 refills | Status: DC

## 2020-10-10 ENCOUNTER — Other Ambulatory Visit: Payer: Self-pay | Admitting: Family Medicine

## 2020-10-10 DIAGNOSIS — R739 Hyperglycemia, unspecified: Secondary | ICD-10-CM

## 2020-10-10 DIAGNOSIS — E785 Hyperlipidemia, unspecified: Secondary | ICD-10-CM

## 2020-11-07 ENCOUNTER — Other Ambulatory Visit: Payer: Self-pay | Admitting: Family Medicine

## 2020-11-07 DIAGNOSIS — E785 Hyperlipidemia, unspecified: Secondary | ICD-10-CM

## 2020-11-13 ENCOUNTER — Encounter: Payer: Self-pay | Admitting: Gastroenterology

## 2020-12-31 ENCOUNTER — Encounter: Payer: Self-pay | Admitting: Gastroenterology

## 2020-12-31 ENCOUNTER — Ambulatory Visit (AMBULATORY_SURGERY_CENTER): Payer: Managed Care, Other (non HMO)

## 2020-12-31 ENCOUNTER — Other Ambulatory Visit: Payer: Self-pay

## 2020-12-31 VITALS — Ht 70.5 in | Wt 240.0 lb

## 2020-12-31 DIAGNOSIS — Z1211 Encounter for screening for malignant neoplasm of colon: Secondary | ICD-10-CM

## 2020-12-31 MED ORDER — PEG-KCL-NACL-NASULF-NA ASC-C 100 G PO SOLR: 1.0000 | Freq: Once | ORAL | 0 refills | Status: AC

## 2020-12-31 NOTE — Progress Notes (Signed)
Pre visit completed via phone call; Patient verified name, DOB, and address; No egg or soy allergy known to patient  No issues known to pt with past sedation with any surgeries or procedures Patient denies ever being told they had issues or difficulty with intubation  No FH of Malignant Hyperthermia Pt is not on diet pills Pt is not on  home 02  Pt is not on blood thinners  Pt denies issues with constipation  No A fib or A flutter  EMMI video via MyChart  COVID 19 guidelines implemented in PV today with Pt and RN  Coupon given to pt in PV today and NO PA's for preps discussed with pt in PV today  Discussed with pt there will be an out-of-pocket cost for prep and that varies from $0 to 70 +  dollars  Due to the COVID-19 pandemic we are asking patients to follow certain guidelines.  Pt aware of COVID protocols and [facility] guidelines

## 2021-01-06 ENCOUNTER — Other Ambulatory Visit: Payer: Managed Care, Other (non HMO)

## 2021-01-13 ENCOUNTER — Other Ambulatory Visit: Payer: Self-pay

## 2021-01-13 ENCOUNTER — Ambulatory Visit (AMBULATORY_SURGERY_CENTER): Payer: Managed Care, Other (non HMO) | Admitting: Gastroenterology

## 2021-01-13 ENCOUNTER — Encounter: Payer: Self-pay | Admitting: Gastroenterology

## 2021-01-13 VITALS — BP 118/70 | HR 70 | Temp 97.5°F | Resp 12 | Ht 70.5 in | Wt 240.0 lb

## 2021-01-13 DIAGNOSIS — Z1211 Encounter for screening for malignant neoplasm of colon: Secondary | ICD-10-CM | POA: Diagnosis not present

## 2021-01-13 DIAGNOSIS — K5730 Diverticulosis of large intestine without perforation or abscess without bleeding: Secondary | ICD-10-CM

## 2021-01-13 MED ORDER — SODIUM CHLORIDE 0.9 % IV SOLN: 500.0000 mL | Freq: Once | INTRAVENOUS | Status: DC

## 2021-01-13 NOTE — Patient Instructions (Signed)
YOU HAD AN ENDOSCOPIC PROCEDURE TODAY AT THE [facility]:   Refer to the procedure report that was given to you for any specific questions about what was found during the examination.  If the procedure report does not answer your questions, please call your gastroenterologist to clarify.  If you requested that your care partner not be given the details of your procedure findings, then the procedure report has been included in a sealed envelope for you to review at your convenience later.  YOU SHOULD EXPECT: Some feelings of bloating in the abdomen. Passage of more gas than usual.  Walking can help get rid of the air that was put into your GI tract during the procedure and reduce the bloating. If you had a lower endoscopy (such as a colonoscopy or flexible sigmoidoscopy) you may notice spotting of blood in your stool or on the toilet paper. If you underwent a bowel prep for your procedure, you may not have a normal bowel movement for a few days.  Please Note:  You might notice some irritation and congestion in your nose or some drainage.  This is from the oxygen used during your procedure.  There is no need for concern and it should clear up in a day or so.  SYMPTOMS TO REPORT IMMEDIATELY:  Following lower endoscopy (colonoscopy or flexible sigmoidoscopy):  Excessive amounts of blood in the stool  Significant tenderness or worsening of abdominal pains  Swelling of the abdomen that is new, acute  Fever of 100F or higher   For urgent or emergent issues, a gastroenterologist can be reached at any hour by calling [phone]. Do not use MyChart messaging for urgent concerns.    DIET:  We do recommend a small meal at first, but then you may proceed to your regular diet.  Drink plenty of fluids but you should avoid alcoholic beverages for 24 hours.  MEDICATIONS:  Continue present medications.  Please see handouts given to you by your recovery nurse.  Thank you for allowing us to  provide for your healthcare needs today.  ACTIVITY:  You should plan to take it easy for the rest of today and you should NOT DRIVE or use heavy machinery until tomorrow (because of the sedation medicines used during the test).    FOLLOW UP: Our staff will call the number listed on your records 48-72 hours following your procedure to check on you and address any questions or concerns that you may have regarding the information given to you following your procedure. If we do not reach you, we will leave a message.  We will attempt to reach you two times.  During this call, we will ask if you have developed any symptoms of COVID 19. If you develop any symptoms (ie: fever, flu-like symptoms, shortness of breath, cough etc.) before then, please call [phone].  If you test positive for Covid 19 in the 2 weeks post procedure, please call and report this information to us.    If any biopsies were taken you will be contacted by phone or by letter within the next 1-3 weeks.  Please call us at [phone] if you have not heard about the biopsies in 3 weeks.    SIGNATURES/CONFIDENTIALITY: You and/or your care partner have signed paperwork which will be entered into your electronic medical record.  These signatures attest to the fact that that the information above on your After Visit Summary has been reviewed and is understood.  Full responsibility of the   confidentiality of this discharge information lies with you and/or your care-partner.  

## 2021-01-13 NOTE — Progress Notes (Signed)
Pt's states no medical or surgical changes since previsit or office visit. VS assessed by D.T 

## 2021-01-13 NOTE — Progress Notes (Signed)
To PACU, VSS. Report to [email or web address] 

## 2021-01-13 NOTE — Op Note (Signed)
[facility] Patient Name: Vincent Pollard Procedure Date: 01/13/2021 10:37 AM MRN: [id number] Endoscopist: Nicki Reaper E. Candis Schatz , MD Age: 45 Referring MD:  Date of Birth: 05-Dec-1975 Gender: Male Account #: [id number] Procedure:                Colonoscopy Indications:              Screening for colorectal malignant neoplasm, This                            is the patient's first colonoscopy Medicines:                Monitored Anesthesia Care Procedure:                Pre-Anesthesia Assessment:                           - Prior to the procedure, a History and Physical                            was performed, and patient medications and                            allergies were reviewed. The patient's tolerance of                            previous anesthesia was also reviewed. The risks                            and benefits of the procedure and the sedation                            options and risks were discussed with the patient.                            All questions were answered, and informed consent                            was obtained. Prior Anticoagulants: The patient has                            taken no previous anticoagulant or antiplatelet                            agents. ASA Grade Assessment: II - A patient with                            mild systemic disease. After reviewing the risks                            and benefits, the patient was deemed in                            satisfactory condition to undergo the procedure.  After obtaining informed consent, the colonoscope                            was passed under direct vision. Throughout the                            procedure, the patient's blood pressure, pulse, and                            oxygen saturations were monitored continuously. The                            CF [id number] #[id number] was introduced through the anus                            and advanced to the  the terminal ileum, with                            identification of the appendiceal orifice and IC                            valve. The colonoscopy was performed without                            difficulty. The patient tolerated the procedure                            well. The quality of the bowel preparation was                            good. The terminal ileum, ileocecal valve,                            appendiceal orifice, and rectum were photographed.                            The bowel preparation used was MoviPrep via split                            dose instruction. Scope In: 10:58:13 AM Scope Out: 11:10:21 AM Scope Withdrawal Time: 0 hours 9 minutes 47 seconds  Total Procedure Duration: 0 hours 12 minutes 8 seconds  Findings:                 The perianal and digital rectal examinations were                            normal. Pertinent negatives include normal                            sphincter tone and no palpable rectal lesions.                           A few small-mouthed diverticula were found in the  sigmoid colon.                           The exam was otherwise normal throughout the                            examined colon.                           The terminal ileum appeared normal.                           The retroflexed view of the distal rectum and anal                            verge was normal and showed no anal or rectal                            abnormalities. Complications:            No immediate complications. Estimated Blood Loss:     Estimated blood loss: none. Impression:               - Diverticulosis in the sigmoid colon.                           - The examined portion of the ileum was normal.                           - The distal rectum and anal verge are normal on                            retroflexion view.                           - No specimens collected. Recommendation:           - Patient has a  contact number available for                            emergencies. The signs and symptoms of potential                            delayed complications were discussed with the                            patient. Return to normal activities tomorrow.                            Written discharge instructions were provided to the                            patient.                           - Resume previous diet.                           -  Continue present medications.                           - Repeat colonoscopy in 10 years for screening                            purposes. Tinya Cadogan E. Candis Schatz, MD 01/13/2021 11:15:02 AM This report has been signed electronically.

## 2021-01-13 NOTE — Progress Notes (Signed)
[facility] History and Physical   Primary Care Physician:  Vincent Pollard, Vincent Apa, DO   Reason for Procedure:   Colon cancer screening  Plan:    Screening colonoscopy     HPI: Vincent Pollard is a 45 y.o. male undergoing initial average risk screening colonoscopy.  He has no chronic GI symptoms and no family history of colon cancer.   Past Medical History:  Diagnosis Date   GERD (gastroesophageal reflux disease)    with certain foods   Hyperlipidemia    on meds   Seasonal allergies    Torn meniscus    right leg    Past Surgical History:  Procedure Laterality Date   MASS EXCISION  04/20/2005   from the left thumb   MENISCUS REPAIR Right 2019   SHOULDER SURGERY Left 04/20/2013   dr Vincent Pollard    Prior to Admission medications   Medication Sig Start Date End Date Taking? Authorizing Provider  fenofibrate 160 MG tablet Take 1 tablet (160 mg total) by mouth daily. 08/05/20  Yes Vincent Schanz R, DO  rosuvastatin (CRESTOR) 10 MG tablet Take 1 tablet (10 mg total) by mouth daily. 11/07/20  Yes Vincent Held, DO    Current Outpatient Medications  Medication Sig Dispense Refill   fenofibrate 160 MG tablet Take 1 tablet (160 mg total) by mouth daily. 90 tablet 1   rosuvastatin (CRESTOR) 10 MG tablet Take 1 tablet (10 mg total) by mouth daily. 90 tablet 0   Current Facility-Administered Medications  Medication Dose Route Frequency Provider Last Rate Last Admin   0.9 %  sodium chloride infusion  500 mL Intravenous Once Vincent November, MD        Allergies as of 01/13/2021 - Review Complete 01/13/2021  Allergen Reaction Noted   Penicillins Other (See Comments) 05/13/2012    Family History  Problem Relation Age of Onset   Hypertension Mother    Diabetes Mother    Colon polyps Father 53   Hypertension Father    Prostate cancer Father    Hypertension Sister    Polycystic ovary syndrome Sister    Prostate cancer Paternal Uncle    Prostate  cancer Paternal Uncle    Diabetes Maternal Grandmother    AAA (abdominal aortic aneurysm) Maternal Grandmother    Stroke Maternal Grandfather    Diabetes Maternal Grandfather    Heart disease Maternal Grandfather    Arthritis Paternal Grandmother    Colon cancer Paternal Grandfather 30   Colon polyps Paternal Grandfather 30   Arthritis Paternal Grandfather    Prostate cancer Paternal Grandfather 55   Esophageal cancer Neg Hx    Rectal cancer Neg Hx    Stomach cancer Neg Hx     Social History   Socioeconomic History   Marital status: Married    Spouse name: Not on file   Number of children: Not on file   Years of education: Not on file   Highest education level: Not on file  Occupational History   Occupation: self employed    Comment: auto detail   Tobacco Use   Smoking status: Never   Smokeless tobacco: Never  Vaping Use   Vaping Use: Never used  Substance and Sexual Activity   Alcohol use: Yes    Comment: once per month   Drug use: No   Sexual activity: Yes    Partners: Female  Other Topics Concern   Not on file  Social History Narrative   Not on file  Social Determinants of Health   Financial Resource Strain: Not on file  Food Insecurity: Not on file  Transportation Needs: Not on file  Physical Activity: Not on file  Stress: Not on file  Social Connections: Not on file  Intimate Partner Violence: Not on file    Review of Systems:  All other review of systems negative except as mentioned in the HPI.  Physical Exam: Vital signs BP 114/69   Pulse 76   Temp (!) 97.5 F (36.4 C) (Skin)   Ht 5' 10.5" (1.791 m)   Wt 240 lb (108.9 kg)   SpO2 97%   BMI 33.95 kg/m   General:   Alert,  Well-developed, well-nourished, pleasant and cooperative in NAD, MP2 Lungs:  Clear throughout to auscultation.   Heart:  Regular rate and rhythm; no murmurs, clicks, rubs,  or gallops. Abdomen:  Soft, nontender and nondistended. Normal bowel sounds.   Neuro/Psych:  Normal mood and affect. A and O x 3   Vincent Pollard E. Vincent Schatz, MD [facility]

## 2021-01-15 ENCOUNTER — Telehealth: Payer: Self-pay

## 2021-01-15 NOTE — Telephone Encounter (Signed)
  Follow up Call-  Call back number 01/13/2021  Post procedure Call Back phone  # [phone]  Permission to leave phone message Yes  Some recent data might be hidden     Patient questions:  Do you have a fever, pain , or abdominal swelling? No. Pain Score  0 *  Have you tolerated food without any problems? Yes.    Have you been able to return to your normal activities? Yes.    Do you have any questions about your discharge instructions: Diet   No. Medications  No. Follow up visit  No.  Do you have questions or concerns about your Care? No.  Actions: * If pain score is 4 or above: No action needed, pain <4. Have you developed a fever since your procedure? no  2.   Have you had an respiratory symptoms (SOB or cough) since your procedure? no  3.   Have you tested positive for COVID 19 since your procedure no  4.   Have you had any family members/close contacts diagnosed with the COVID 19 since your procedure?  no   If yes to any of these questions please route to Joylene John, RN and Joella Prince, RN

## 2021-03-08 ENCOUNTER — Other Ambulatory Visit: Payer: Self-pay | Admitting: Family Medicine

## 2021-03-08 DIAGNOSIS — E785 Hyperlipidemia, unspecified: Secondary | ICD-10-CM

## 2021-05-22 ENCOUNTER — Ambulatory Visit: Payer: Managed Care, Other (non HMO) | Admitting: Family Medicine

## 2021-05-22 ENCOUNTER — Encounter: Payer: Self-pay | Admitting: Family Medicine

## 2021-05-22 VITALS — BP 120/78 | HR 94 | Temp 97.8°F | Resp 18 | Ht 70.5 in | Wt 239.6 lb

## 2021-05-22 DIAGNOSIS — N5089 Other specified disorders of the male genital organs: Secondary | ICD-10-CM | POA: Diagnosis not present

## 2021-05-22 DIAGNOSIS — R1031 Right lower quadrant pain: Secondary | ICD-10-CM

## 2021-05-22 LAB — POC URINALSYSI DIPSTICK (AUTOMATED)
Blood, UA: NEGATIVE
Glucose, UA: NEGATIVE
Ketones, UA: NEGATIVE
Leukocytes, UA: NEGATIVE
Nitrite, UA: NEGATIVE
Protein, UA: NEGATIVE
Spec Grav, UA: 1.01 (ref 1.010–1.025)
Urobilinogen, UA: 0.2 E.U./dL
pH, UA: 7 (ref 5.0–8.0)

## 2021-05-22 NOTE — Progress Notes (Signed)
Established Patient Office Visit  Subjective:  Patient ID: Vincent Pollard, male    DOB: 10/05/1975  Age: 46 y.o. MRN: [id number]  CC:  Chief Complaint  Patient presents with   Groin Pain    X1 week, pt states having a pulling sensation on the right groin. Pt states noticing a knot on the right testicle. Pt states no urinary sxs. Pt states it is possible he picked up something heavy. Pt states having vasectomy in 2008.    HPI Vincent Pollard presents for pain R testicle and lump felt x 1 week.  No dysuria or penile d/c   Past Medical History:  Diagnosis Date   GERD (gastroesophageal reflux disease)    with certain foods   Hyperlipidemia    on meds   Seasonal allergies    Torn meniscus    right leg    Past Surgical History:  Procedure Laterality Date   MASS EXCISION  04/20/2005   from the left thumb   MENISCUS REPAIR Right 2019   SHOULDER SURGERY Left 04/20/2013   dr Theda Sers   VASECTOMY      Family History  Problem Relation Age of Onset   Hypertension Mother    Diabetes Mother    Colon polyps Father 14   Hypertension Father    Prostate cancer Father    Hypertension Sister    Polycystic ovary syndrome Sister    Prostate cancer Paternal Uncle    Prostate cancer Paternal Uncle    Diabetes Maternal Grandmother    AAA (abdominal aortic aneurysm) Maternal Grandmother    Stroke Maternal Grandfather    Diabetes Maternal Grandfather    Heart disease Maternal Grandfather    Arthritis Paternal Grandmother    Colon cancer Paternal Grandfather 71   Colon polyps Paternal Grandfather 17   Arthritis Paternal Grandfather    Prostate cancer Paternal Grandfather 57   Esophageal cancer Neg Hx    Rectal cancer Neg Hx    Stomach cancer Neg Hx     Social History   Socioeconomic History   Marital status: Married    Spouse name: Not on file   Number of children: Not on file   Years of education: Not on file   Highest education level: Not on file  Occupational History    Occupation: self employed    Comment: auto detail   Tobacco Use   Smoking status: Never   Smokeless tobacco: Never  Vaping Use   Vaping Use: Never used  Substance and Sexual Activity   Alcohol use: Yes    Comment: once per month   Drug use: No   Sexual activity: Yes    Partners: Female  Other Topics Concern   Not on file  Social History Narrative   Not on file   Social Determinants of Health   Financial Resource Strain: Not on file  Food Insecurity: Not on file  Transportation Needs: Not on file  Physical Activity: Not on file  Stress: Not on file  Social Connections: Not on file  Intimate Partner Violence: Not on file    Outpatient Medications Prior to Visit  Medication Sig Dispense Refill   fenofibrate 160 MG tablet TAKE 1 TABLET BY MOUTH EVERY DAY 30 tablet 5   rosuvastatin (CRESTOR) 10 MG tablet Take 1 tablet (10 mg total) by mouth daily. 90 tablet 0   Facility-Administered Medications Prior to Visit  Medication Dose Route Frequency Provider Last Rate Last Admin   0.9 %  sodium chloride infusion  500 mL Intravenous Once Daryel November, MD        Allergies  Allergen Reactions   Penicillins Other (See Comments)    Childhood reaction unknown    ROS Review of Systems  Constitutional:  Negative for fever.  HENT:  Negative for congestion.   Respiratory:  Negative for shortness of breath.   Cardiovascular:  Negative for chest pain, palpitations and leg swelling.  Gastrointestinal:  Negative for abdominal pain, blood in stool and nausea.  Genitourinary:  Positive for scrotal swelling and testicular pain. Negative for difficulty urinating, dysuria, frequency, penile discharge, penile pain and penile swelling.  Skin:  Negative for rash.  Allergic/Immunologic: Negative for environmental allergies.  Neurological:  Negative for dizziness and headaches.  Psychiatric/Behavioral:  The patient is not nervous/anxious.      Objective:    Physical Exam Vitals and  nursing note reviewed.  Constitutional:      Appearance: He is well-developed.  HENT:     Head: Normocephalic and atraumatic.  Eyes:     Pupils: Pupils are equal, round, and reactive to light.  Neck:     Thyroid: No thyromegaly.  Cardiovascular:     Rate and Rhythm: Normal rate and regular rhythm.     Heart sounds: No murmur heard. Pulmonary:     Effort: Pulmonary effort is normal. No respiratory distress.     Breath sounds: Normal breath sounds. No wheezing or rales.  Chest:     Chest wall: No tenderness.  Abdominal:     Hernia: There is no hernia in the left inguinal area or right inguinal area.  Genitourinary:    Penis: Normal.      Testes:        Right: Mass present. Tenderness or swelling not present. Right testis is descended. Cremasteric reflex is present.      Epididymis:     Right: Normal.     Left: Normal.  Musculoskeletal:        General: No tenderness.     Cervical back: Normal range of motion and neck supple.  Skin:    General: Skin is warm and dry.  Neurological:     Mental Status: He is alert and oriented to person, place, and time.  Psychiatric:        Behavior: Behavior normal.        Thought Content: Thought content normal.        Judgment: Judgment normal.    BP 120/78 (BP Location: Left Arm, Patient Position: Sitting, Cuff Size: Large)    Pulse 94    Temp 97.8 F (36.6 C) (Oral)    Resp 18    Ht 5' 10.5" (1.791 m)    Wt 239 lb 9.6 oz (108.7 kg)    SpO2 97%    BMI 33.89 kg/m  Wt Readings from Last 3 Encounters:  05/22/21 239 lb 9.6 oz (108.7 kg)  01/13/21 240 lb (108.9 kg)  12/31/20 240 lb (108.9 kg)     Health Maintenance Due  Topic Date Due   COVID-19 Vaccine (1) Never done   INFLUENZA VACCINE  Never done    There are no preventive care reminders to display for this patient.  Lab Results  Component Value Date   TSH 2.35 10/04/2020   Lab Results  Component Value Date   WBC 5.3 10/04/2020   HGB 15.3 10/04/2020   HCT 45.5 10/04/2020    MCV 88.7 10/04/2020   PLT 259.0 10/04/2020   Lab Results  Component Value Date  NA 139 10/04/2020   K 4.4 10/04/2020   CO2 28 10/04/2020   GLUCOSE 116 (H) 10/04/2020   BUN 13 10/04/2020   CREATININE 1.13 10/04/2020   BILITOT 0.6 10/04/2020   ALKPHOS 34 (L) 10/04/2020   AST 17 10/04/2020   ALT 27 10/04/2020   PROT 7.5 10/04/2020   ALBUMIN 4.8 10/04/2020   CALCIUM 10.1 10/04/2020   GFR 78.12 10/04/2020   Lab Results  Component Value Date   CHOL 285 (H) 10/04/2020   Lab Results  Component Value Date   HDL 42.60 10/04/2020   Lab Results  Component Value Date   LDLCALC 217 (H) 10/04/2020   Lab Results  Component Value Date   TRIG 125.0 10/04/2020   Lab Results  Component Value Date   CHOLHDL 7 10/04/2020   No results found for: HGBA1C    Assessment & Plan:   Problem List Items Addressed This Visit   None Visit Diagnoses     Groin discomfort, right    -  Primary   Relevant Orders   POCT Urinalysis Dipstick (Automated) (Completed)   Testicular nodule       Relevant Orders   US SCROTUM W/DOPPLER       No orders of the defined types were placed in this encounter.   Follow-up: Return if symptoms worsen or fail to improve.    Ann Held, DO

## 2021-05-22 NOTE — Patient Instructions (Signed)
Scrotal Swelling Scrotal swelling is a condition in which the sac of skin that contains the testicles, blood vessels, and structures that help deliver sperm and semen (scrotum) is enlarged or swollen. This can happen on one or both sides of the scrotum. Many things can cause the scrotum to enlarge or swell, including: Fluid around the testicle (hydrocele). A weakened area in the muscles around the groin (hernia). An enlarged vein around the testicle. An injury. An infection. Certain medical treatments. Certain medical conditions, such as congestive heart failure. A recent genital surgery or procedure. A twisting of the spermatic cord that cuts off blood supply (testicular torsion). Testicular cancer. Scrotal swelling can happen along with scrotal pain. Follow these instructions at home: Activity Rest as told by your health care provider. The best position is to lie down. Do not lift anything that is heavier than 5 lb (2.3 kg), or the limit that you are told, until your health care provider says that it is safe. Avoid sexual activity until your health care provider says that it is safe. General instructions Take over-the-counter and prescription medicines only as told by your health care provider. Perform a monthly self-exam of the scrotum and penis. Feel for changes. Ask your health care provider how to perform a monthly self-exam if you are unsure. Keep all follow-up visits. This is important. Managing pain, stiffness, and swelling If directed, put ice on the affected area. To do this: Put ice in a plastic bag. Place a towel between your skin and the bag. Leave the ice on for 20 minutes, 2-3 times a day. Remove the ice if your skin turns bright red. This is very important. If you cannot feel pain, heat, or cold, you have a greater risk of damage to the area. Place a rolled towel under your testicles for support or use underwear with a supportive pouch. Wear an athletic support cup or  scrotal support, such as a jock strap, for comfort. Contact a health care provider if: You have sudden pain that is persistent and does not improve. You have a heavy feeling or notice fluid in the scrotum. You have pain or burning while urinating. You have blood in your urine or semen. You feel a lump around the testicle. You notice that one testicle is larger than the other. Keep in mind that a small difference in size is normal. You have a persistent dull ache or pain in your groin or scrotum. Get help right away if: The pain does not go away. The pain becomes severe. You have a fever or chills. You have pain or vomiting that cannot be controlled. One or both sides of the scrotum are very red and swollen. There is redness spreading upward from your scrotum to your abdomen or downward from your scrotum to your thighs. Summary Scrotal swelling is a condition in which the sac of skin that contains the testicles, blood vessels, and structures that help deliver the sperm and semen (scrotum) is enlarged or swollen. Many things can cause the scrotum to swell, including fluid around the testicle (hydrocele), a weakened area in the muscles around the groin (hernia), and an enlarged vein around the testicle. Icing the scrotum or using underwear with a supportive pouch may help reduce swelling and pain. Contact a health care provider if you develop scrotal pain that is sudden and persistent, you have pain while urinating, you feel a lump around the testicle, or you notice blood in your urine or semen. Get help right away  if you have uncontrolled pain or vomiting, a very red and swollen scrotum, or a fever or chills. This information is not intended to replace advice given to you by your health care provider. Make sure you discuss any questions you have with your health care provider. Document Revised: 12/05/2019 Document Reviewed: 12/05/2019 Elsevier Patient Education  2022 [employer].

## 2021-05-22 NOTE — Assessment & Plan Note (Signed)
ua normal Check US scrotum

## 2021-05-28 ENCOUNTER — Ambulatory Visit (HOSPITAL_BASED_OUTPATIENT_CLINIC_OR_DEPARTMENT_OTHER)
Admission: RE | Admit: 2021-05-28 | Discharge: 2021-05-28 | Disposition: A | Discharge status: Home / Self Care | Payer: Managed Care, Other (non HMO) | Source: Ambulatory Visit | Attending: Family Medicine | Admitting: Family Medicine

## 2021-05-28 ENCOUNTER — Other Ambulatory Visit: Payer: Self-pay

## 2021-05-28 DIAGNOSIS — N5089 Other specified disorders of the male genital organs: Secondary | ICD-10-CM | POA: Diagnosis not present

## 2021-05-30 ENCOUNTER — Other Ambulatory Visit: Payer: Self-pay

## 2021-05-30 DIAGNOSIS — N5089 Other specified disorders of the male genital organs: Secondary | ICD-10-CM

## 2021-06-08 ENCOUNTER — Other Ambulatory Visit: Payer: Self-pay | Admitting: Family Medicine

## 2021-06-08 DIAGNOSIS — E785 Hyperlipidemia, unspecified: Secondary | ICD-10-CM

## 2021-09-29 ENCOUNTER — Encounter: Payer: Self-pay | Admitting: Family Medicine

## 2021-09-29 ENCOUNTER — Ambulatory Visit (INDEPENDENT_AMBULATORY_CARE_PROVIDER_SITE_OTHER): Payer: Managed Care, Other (non HMO) | Admitting: Family Medicine

## 2021-09-29 VITALS — BP 110/80 | HR 83 | Temp 98.2°F | Resp 18 | Ht 70.5 in | Wt 238.6 lb

## 2021-09-29 DIAGNOSIS — E7849 Other hyperlipidemia: Secondary | ICD-10-CM | POA: Diagnosis not present

## 2021-09-29 DIAGNOSIS — R739 Hyperglycemia, unspecified: Secondary | ICD-10-CM | POA: Diagnosis not present

## 2021-09-29 DIAGNOSIS — Z0000 Encounter for general adult medical examination without abnormal findings: Secondary | ICD-10-CM

## 2021-09-29 LAB — CBC WITH DIFFERENTIAL/PLATELET
Basophils Absolute: 0 10*3/uL (ref 0.0–0.1)
Basophils Relative: 0.7 % (ref 0.0–3.0)
Eosinophils Absolute: 0.3 10*3/uL (ref 0.0–0.7)
Eosinophils Relative: 5.9 % — ABNORMAL HIGH (ref 0.0–5.0)
HCT: 45.3 % (ref 39.0–52.0)
Hemoglobin: 15.2 g/dL (ref 13.0–17.0)
Lymphocytes Relative: 33.7 % (ref 12.0–46.0)
Lymphs Abs: 1.6 10*3/uL (ref 0.7–4.0)
MCHC: 33.6 g/dL (ref 30.0–36.0)
MCV: 89.4 fl (ref 78.0–100.0)
Monocytes Absolute: 0.5 10*3/uL (ref 0.1–1.0)
Monocytes Relative: 11.5 % (ref 3.0–12.0)
Neutro Abs: 2.3 10*3/uL (ref 1.4–7.7)
Neutrophils Relative %: 48.2 % (ref 43.0–77.0)
Platelets: 258 10*3/uL (ref 150.0–400.0)
RBC: 5.07 Mil/uL (ref 4.22–5.81)
RDW: 13.3 % (ref 11.5–15.5)
WBC: 4.7 10*3/uL (ref 4.0–10.5)

## 2021-09-29 LAB — COMPREHENSIVE METABOLIC PANEL
ALT: 33 U/L (ref 0–53)
AST: 19 U/L (ref 0–37)
Albumin: 4.7 g/dL (ref 3.5–5.2)
Alkaline Phosphatase: 30 U/L — ABNORMAL LOW (ref 39–117)
BUN: 11 mg/dL (ref 6–23)
CO2: 29 mEq/L (ref 19–32)
Calcium: 10.1 mg/dL (ref 8.4–10.5)
Chloride: 102 mEq/L (ref 96–112)
Creatinine, Ser: 1.19 mg/dL (ref 0.40–1.50)
GFR: 72.91 mL/min (ref >60.00)
Glucose, Bld: 135 mg/dL — ABNORMAL HIGH (ref 70–99)
Potassium: 4.5 mEq/L (ref 3.5–5.1)
Sodium: 139 mEq/L (ref 135–145)
Total Bilirubin: 0.6 mg/dL (ref 0.2–1.2)
Total Protein: 7.3 g/dL (ref 6.0–8.3)

## 2021-09-29 LAB — LIPID PANEL
Cholesterol: 223 mg/dL — ABNORMAL HIGH (ref 0–200)
HDL: 43 mg/dL (ref >39.00)
LDL Cholesterol: 157 mg/dL — ABNORMAL HIGH (ref 0–99)
NonHDL: 180.41
Total CHOL/HDL Ratio: 5
Triglycerides: 117 mg/dL (ref 0.0–149.0)
VLDL: 23.4 mg/dL (ref 0.0–40.0)

## 2021-09-29 LAB — PSA: PSA: 0.95 ng/mL (ref 0.10–4.00)

## 2021-09-29 LAB — HEMOGLOBIN A1C: Hgb A1c MFr Bld: 6.3 % (ref 4.6–6.5)

## 2021-09-29 LAB — TSH: TSH: 1.78 u[IU]/mL (ref 0.35–5.50)

## 2021-09-29 NOTE — Patient Instructions (Signed)
Carbohydrate Counting for Diabetes Mellitus, Adult Carbohydrate counting is a method of keeping track of how many carbohydrates you eat. Eating carbohydrates increases the amount of sugar (glucose) in the blood. Counting how many carbohydrates you eat improves how well you manage your blood glucose. This, in turn, helps you manage your diabetes. Carbohydrates are measured in grams (g) per serving. It is important to know how many carbohydrates (in grams or by serving size) you can have in each meal. This is different for every person. A dietitian can help you make a meal plan and calculate how many carbohydrates you should have at each meal and snack. What foods contain carbohydrates? Carbohydrates are found in the following foods: Grains, such as breads and cereals. Dried beans and soy products. Starchy vegetables, such as potatoes, peas, and corn. Fruit and fruit juices. Milk and yogurt. Sweets and snack foods, such as cake, cookies, candy, chips, and soft drinks. How do I count carbohydrates in foods? There are two ways to count carbohydrates in food. You can read food labels or learn standard serving sizes of foods. You can use either of these methods or a combination of both. Using the Nutrition Facts label The Nutrition Facts list is included on the labels of almost all packaged foods and beverages in the [place]. It includes: The serving size. Information about nutrients in each serving, including the grams of carbohydrate per serving. To use the Nutrition Facts, decide how many servings you will have. Then, multiply the number of servings by the number of carbohydrates per serving. The resulting number is the total grams of carbohydrates that you will be having. Learning the standard serving sizes of foods When you eat carbohydrate foods that are not packaged or do not include Nutrition Facts on the label, you need to measure the servings in order to count the grams of  carbohydrates. Measure the foods that you will eat with a food scale or measuring cup, if needed. Decide how many standard-size servings you will eat. Multiply the number of servings by 15. For foods that contain carbohydrates, one serving equals 15 g of carbohydrates. For example, if you eat 2 cups or 10 oz (300 g) of strawberries, you will have eaten 2 servings and 30 g of carbohydrates (2 servings x 15 g = 30 g). For foods that have more than one food mixed, such as soups and casseroles, you must count the carbohydrates in each food that is included. The following list contains standard serving sizes of common carbohydrate-rich foods. Each of these servings has about 15 g of carbohydrates: 1 slice of bread. 1 six-inch (15 cm) tortilla. ? cup or 2 oz (53 g) cooked rice or pasta.  cup or 3 oz (85 g) cooked or canned, drained and rinsed beans or lentils.  cup or 3 oz (85 g) starchy vegetable, such as peas, corn, or squash.  cup or 4 oz (120 g) hot cereal.  cup or 3 oz (85 g) boiled or mashed potatoes, or  or 3 oz (85 g) of a large baked potato.  cup or 4 fl oz (118 mL) fruit juice. 1 cup or 8 fl oz (237 mL) milk. 1 small or 4 oz (106 g) apple.  or 2 oz (63 g) of a medium banana. 1 cup or 5 oz (150 g) strawberries. 3 cups or 1 oz (28.3 g) popped popcorn. What is an example of carbohydrate counting? To calculate the grams of carbohydrates in this sample meal, follow the steps  shown below. Sample meal 3 oz (85 g) chicken breast. ? cup or 4 oz (106 g) brown rice.  cup or 3 oz (85 g) corn. 1 cup or 8 fl oz (237 mL) milk. 1 cup or 5 oz (150 g) strawberries with sugar-free whipped topping. Carbohydrate calculation Identify the foods that contain carbohydrates: Rice. Corn. Milk. Strawberries. Calculate how many servings you have of each food: 2 servings rice. 1 serving corn. 1 serving milk. 1 serving strawberries. Multiply each number of servings by 15 g: 2 servings rice x 15  g = 30 g. 1 serving corn x 15 g = 15 g. 1 serving milk x 15 g = 15 g. 1 serving strawberries x 15 g = 15 g. Add together all of the amounts to find the total grams of carbohydrates eaten: 30 g + 15 g + 15 g + 15 g = 75 g of carbohydrates total. What are tips for following this plan? Shopping Develop a meal plan and then make a shopping list. Buy fresh and frozen vegetables, fresh and frozen fruit, dairy, eggs, beans, lentils, and whole grains. Look at food labels. Choose foods that have more fiber and less sugar. Avoid processed foods and foods with added sugars. Meal planning Aim to have the same number of grams of carbohydrates at each meal and for each snack time. Plan to have regular, balanced meals and snacks. Where to find more information American Diabetes Association: diabetes.org Centers for Disease Control and Prevention: StoreMirror.com.[place] Academy of Nutrition and Dietetics: eatright.org Association of Diabetes Care & Education Specialists: diabeteseducator.org Summary Carbohydrate counting is a method of keeping track of how many carbohydrates you eat. Eating carbohydrates increases the amount of sugar (glucose) in your blood. Counting how many carbohydrates you eat improves how well you manage your blood glucose. This helps you manage your diabetes. A dietitian can help you make a meal plan and calculate how many carbohydrates you should have at each meal and snack. This information is not intended to replace advice given to you by your health care provider. Make sure you discuss any questions you have with your health care provider. Document Revised: 11/08/2019 Document Reviewed: 11/08/2019 Elsevier Patient Education  2023 Elsevier Inc. Preventive Care 59-78 Years Old, Male Preventive care refers to lifestyle choices and visits with your health care provider that can promote health and wellness. Preventive care visits are also called wellness exams. What can I expect for my  preventive care visit? Counseling During your preventive care visit, your health care provider may ask about your: Medical history, including: Past medical problems. Family medical history. Current health, including: Emotional well-being. Home life and relationship well-being. Sexual activity. Lifestyle, including: Alcohol, nicotine or tobacco, and drug use. Access to firearms. Diet, exercise, and sleep habits. Safety issues such as seatbelt and bike helmet use. Sunscreen use. Work and work Statistician. Physical exam Your health care provider will check your: Height and weight. These may be used to calculate your BMI (body mass index). BMI is a measurement that tells if you are at a healthy weight. Waist circumference. This measures the distance around your waistline. This measurement also tells if you are at a healthy weight and may help predict your risk of certain diseases, such as type 2 diabetes and high blood pressure. Heart rate and blood pressure. Body temperature. Skin for abnormal spots. What immunizations do I need?  Vaccines are usually given at various ages, according to a schedule. Your health care provider will recommend vaccines for  you based on your age, medical history, and lifestyle or other factors, such as travel or where you work. What tests do I need? Screening Your health care provider may recommend screening tests for certain conditions. This may include: Lipid and cholesterol levels. Diabetes screening. This is done by checking your blood sugar (glucose) after you have not eaten for a while (fasting). Hepatitis B test. Hepatitis C test. HIV (human immunodeficiency virus) test. STI (sexually transmitted infection) testing, if you are at risk. Lung cancer screening. Prostate cancer screening. Colorectal cancer screening. Talk with your health care provider about your test results, treatment options, and if necessary, the need for more tests. Follow  these instructions at home: Eating and drinking  Eat a diet that includes fresh fruits and vegetables, whole grains, lean protein, and low-fat dairy products. Take vitamin and mineral supplements as recommended by your health care provider. Do not drink alcohol if your health care provider tells you not to drink. If you drink alcohol: Limit how much you have to 0-2 drinks a day. Know how much alcohol is in your drink. In the [place] one drink equals one 12 oz bottle of beer (355 mL), one 5 oz glass of wine (148 mL), or one 1 oz glass of hard liquor (44 mL). Lifestyle Brush your teeth every morning and night with fluoride toothpaste. Floss one time each day. Exercise for at least 30 minutes 5 or more days each week. Do not use any products that contain nicotine or tobacco. These products include cigarettes, chewing tobacco, and vaping devices, such as e-cigarettes. If you need help quitting, ask your health care provider. Do not use drugs. If you are sexually active, practice safe sex. Use a condom or other form of protection to prevent STIs. Take aspirin only as told by your health care provider. Make sure that you understand how much to take and what form to take. Work with your health care provider to find out whether it is safe and beneficial for you to take aspirin daily. Find healthy ways to manage stress, such as: Meditation, yoga, or listening to music. Journaling. Talking to a trusted person. Spending time with friends and family. Minimize exposure to UV radiation to reduce your risk of skin cancer. Safety Always wear your seat belt while driving or riding in a vehicle. Do not drive: If you have been drinking alcohol. Do not ride with someone who has been drinking. When you are tired or distracted. While texting. If you have been using any mind-altering substances or drugs. Wear a helmet and other protective equipment during sports activities. If you have firearms in your house,  make sure you follow all gun safety procedures. What's next? Go to your health care provider once a year for an annual wellness visit. Ask your health care provider how often you should have your eyes and teeth checked. Stay up to date on all vaccines. This information is not intended to replace advice given to you by your health care provider. Make sure you discuss any questions you have with your health care provider. Document Revised: 10/02/2020 Document Reviewed: 10/02/2020 Elsevier Patient Education  [place].

## 2021-09-29 NOTE — Assessment & Plan Note (Signed)
Tolerating statin, encouraged heart healthy diet, avoid trans fats, minimize simple carbs and saturated fats. Increase exercise as tolerated 

## 2021-09-29 NOTE — Assessment & Plan Note (Signed)
ghm utd Check labs  See avs  

## 2021-09-29 NOTE — Progress Notes (Signed)
Subjective:   By signing my name below, I, Vincent Pollard, attest that this documentation has been prepared under the direction and in the presence of Ann Held DO 09/29/2021     Patient ID: Vincent Pollard, male    DOB: Nov 27, 1975, 46 y.o.   MRN: [id number]  Chief Complaint  Patient presents with   Annual Exam    Pt states fasting     HPI Patient is in today for a comprehensive physical exam.  He is requesting to get his A1C levels checked. He states that hyperglycemia runs within the family, his mom and sister are reported to have Type 2 diabetes. He does not currently see any specialist. He has bought a glucose machine and reports that readings run between 119-121 mg/dL when he is fasting.   He denies having any fever, new muscle pain, joint pain , new moles, congestion, sinus pain, sore throat, chest pain, palpations, cough, SOB ,wheezing,n/v/d constipation, blood in stool, dysuria, frequency, hematuria, at this time  He denies of any changes to his family medical history. 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  Colonoscopy last completed on 01/13/2021 PSA last completed on 10/04/2020 He has not received any of the Covid-19 vaccines. He is UTD on his other vaccines. He is trying to watch his diet.  He walks about 4 days out of the week for about 40 minutes.  He is UTD on dental exams.  He is UTD on vision exams. He reports that his eye specialist did not state any changes to his eye.   Past Medical History:  Diagnosis Date   GERD (gastroesophageal reflux disease)    with certain foods   Hyperlipidemia    on meds   Seasonal allergies    Torn meniscus    right leg    Past Surgical History:  Procedure Laterality Date   MASS EXCISION  04/20/2005   from the left thumb   MENISCUS REPAIR Right 2019   SHOULDER SURGERY Left 04/20/2013   dr Theda Sers   VASECTOMY      Family History  Problem Relation Age of Onset    Hypertension Mother    Diabetes Mother    Colon polyps Father 61   Hypertension Father    Prostate cancer Father    Diabetes Sister    Hypertension Sister    Polycystic ovary syndrome Sister    Diabetes Maternal Grandmother    AAA (abdominal aortic aneurysm) Maternal Grandmother    Stroke Maternal Grandfather    Diabetes Maternal Grandfather    Heart disease Maternal Grandfather    Arthritis Paternal Grandmother    Colon cancer Paternal Grandfather 36   Colon polyps Paternal Grandfather 40   Arthritis Paternal Grandfather    Prostate cancer Paternal Grandfather 50   Prostate cancer Paternal Uncle    Prostate cancer Paternal Uncle    Esophageal cancer Neg Hx    Rectal cancer Neg Hx    Stomach cancer Neg Hx     Social History   Socioeconomic History   Marital status: Married    Spouse name: Not on file   Number of children: Not on file   Years of education: Not on file   Highest education level: Not on file  Occupational History   Occupation: self employed    Comment: auto detail    Occupation: mobile detail    Comment: self employed  Tobacco Use   Smoking status:  Never   Smokeless tobacco: Never  Vaping Use   Vaping Use: Never used  Substance and Sexual Activity   Alcohol use: Yes    Comment: once per month   Drug use: No   Sexual activity: Yes    Partners: Female  Other Topics Concern   Not on file  Social History Narrative   Exercise--- walking 4 days a weeks for 40 min    Social Determinants of Health   Financial Resource Strain: Not on file  Food Insecurity: Not on file  Transportation Needs: Not on file  Physical Activity: Not on file  Stress: Not on file  Social Connections: Not on file  Intimate Partner Violence: Not on file    Outpatient Medications Prior to Visit  Medication Sig Dispense Refill   fenofibrate 160 MG tablet TAKE 1 TABLET BY MOUTH EVERY DAY 30 tablet 5   rosuvastatin (CRESTOR) 10 MG tablet TAKE 1 TABLET BY MOUTH EVERY DAY 30  tablet 2   Facility-Administered Medications Prior to Visit  Medication Dose Route Frequency Provider Last Rate Last Admin   0.9 %  sodium chloride infusion  500 mL Intravenous Once Daryel November, MD        Allergies  Allergen Reactions   Penicillins Other (See Comments)    Childhood reaction unknown    Review of Systems  Constitutional:  Negative for fever.  HENT:  Negative for congestion, ear pain, sinus pain and sore throat.   Respiratory:  Negative for wheezing.   Cardiovascular:  Negative for palpitations.  Gastrointestinal:  Negative for blood in stool, constipation, diarrhea, nausea and vomiting.  Genitourinary:  Negative for dysuria, frequency and hematuria.  Musculoskeletal:  Negative for joint pain and myalgias.  Skin:        (-) New Moles       Objective:    Physical Exam Vitals and nursing note reviewed.  Constitutional:      General: He is not in acute distress.    Appearance: Normal appearance. He is not ill-appearing.  HENT:     Head: Normocephalic and atraumatic.     Right Ear: Tympanic membrane, ear canal and external ear normal.     Left Ear: Tympanic membrane, ear canal and external ear normal.  Eyes:     Extraocular Movements: Extraocular movements intact.     Pupils: Pupils are equal, round, and reactive to light.  Cardiovascular:     Rate and Rhythm: Normal rate and regular rhythm.     Heart sounds: Normal heart sounds. No murmur heard.    No gallop.  Pulmonary:     Effort: Pulmonary effort is normal. No respiratory distress.     Breath sounds: Normal breath sounds. No wheezing or rales.  Abdominal:     General: Bowel sounds are normal. There is no distension.     Palpations: Abdomen is soft.     Tenderness: There is no abdominal tenderness. There is no guarding.  Skin:    General: Skin is warm and dry.  Neurological:     Mental Status: He is alert and oriented to person, place, and time.  Psychiatric:        Judgment: Judgment  normal.     BP 110/80 (BP Location: Left Arm, Patient Position: Sitting, Cuff Size: Normal)   Pulse 83   Temp 98.2 F (36.8 C) (Oral)   Resp 18   Ht 5' 10.5" (1.791 m)   Wt 238 lb 9.6 oz (108.2 kg)   SpO2 98%  BMI 33.75 kg/m  Wt Readings from Last 3 Encounters:  09/29/21 238 lb 9.6 oz (108.2 kg)  05/22/21 239 lb 9.6 oz (108.7 kg)  01/13/21 240 lb (108.9 kg)    Diabetic Foot Exam - Simple   No data filed    Lab Results  Component Value Date   WBC 5.3 10/04/2020   HGB 15.3 10/04/2020   HCT 45.5 10/04/2020   PLT 259.0 10/04/2020   GLUCOSE 116 (H) 10/04/2020   CHOL 285 (H) 10/04/2020   TRIG 125.0 10/04/2020   HDL 42.60 10/04/2020   LDLDIRECT 213.0 11/29/2018   LDLCALC 217 (H) 10/04/2020   ALT 27 10/04/2020   AST 17 10/04/2020   NA 139 10/04/2020   K 4.4 10/04/2020   CL 103 10/04/2020   CREATININE 1.13 10/04/2020   BUN 13 10/04/2020   CO2 28 10/04/2020   TSH 2.35 10/04/2020   PSA 1.11 10/04/2020    Lab Results  Component Value Date   TSH 2.35 10/04/2020   Lab Results  Component Value Date   WBC 5.3 10/04/2020   HGB 15.3 10/04/2020   HCT 45.5 10/04/2020   MCV 88.7 10/04/2020   PLT 259.0 10/04/2020   Lab Results  Component Value Date   NA 139 10/04/2020   K 4.4 10/04/2020   CO2 28 10/04/2020   GLUCOSE 116 (H) 10/04/2020   BUN 13 10/04/2020   CREATININE 1.13 10/04/2020   BILITOT 0.6 10/04/2020   ALKPHOS 34 (L) 10/04/2020   AST 17 10/04/2020   ALT 27 10/04/2020   PROT 7.5 10/04/2020   ALBUMIN 4.8 10/04/2020   CALCIUM 10.1 10/04/2020   GFR 78.12 10/04/2020   Lab Results  Component Value Date   CHOL 285 (H) 10/04/2020   Lab Results  Component Value Date   HDL 42.60 10/04/2020   Lab Results  Component Value Date   LDLCALC 217 (H) 10/04/2020   Lab Results  Component Value Date   TRIG 125.0 10/04/2020   Lab Results  Component Value Date   CHOLHDL 7 10/04/2020   No results found for: "HGBA1C"     Assessment & Plan:   Problem List  Items Addressed This Visit       Unprioritized   Preventative health care - Primary    ghm utd Check labs  See avs       Relevant Orders   CBC with Differential/Platelet   Comprehensive metabolic panel   Lipid panel   PSA   TSH   Hemoglobin A1c   Hyperlipidemia    Tolerating statin, encouraged heart healthy diet, avoid trans fats, minimize simple carbs and saturated fats. Increase exercise as tolerated      Relevant Orders   Comprehensive metabolic panel   Lipid panel   Other Visit Diagnoses     Hyperglycemia       Relevant Orders   Comprehensive metabolic panel   Hemoglobin A1c       No orders of the defined types were placed in this encounter.   IAnn Held, DO, personally preformed the services described in this documentation.  All medical record entries made by the scribe were at my direction and in my presence.  I have reviewed the chart and discharge instructions (if applicable) and agree that the record reflects my personal performance and is accurate and complete. 09/29/2021   I,Amber Collins,acting as a scribe for [employer], DO.,have documented all relevant documentation on the behalf of Ann Held, DO,as  directed by  Carmine Held, DO while in the presence of Paytyn Held, DO.  Michiko Held, DO

## 2021-09-30 ENCOUNTER — Other Ambulatory Visit: Payer: Self-pay | Admitting: Family Medicine

## 2021-09-30 DIAGNOSIS — E7849 Other hyperlipidemia: Secondary | ICD-10-CM

## 2021-10-01 ENCOUNTER — Other Ambulatory Visit: Payer: Self-pay

## 2021-10-01 MED ORDER — ROSUVASTATIN CALCIUM 20 MG PO TABS: 20.0000 mg | ORAL_TABLET | Freq: Every day | ORAL | 2 refills | Status: DC

## 2021-10-26 ENCOUNTER — Other Ambulatory Visit: Payer: Self-pay | Admitting: Family Medicine

## 2021-10-26 DIAGNOSIS — E785 Hyperlipidemia, unspecified: Secondary | ICD-10-CM

## 2022-03-11 ENCOUNTER — Other Ambulatory Visit: Payer: Self-pay | Admitting: Family Medicine

## 2022-03-30 ENCOUNTER — Other Ambulatory Visit: Payer: Self-pay | Admitting: Family Medicine

## 2022-03-30 ENCOUNTER — Ambulatory Visit: Payer: Managed Care, Other (non HMO) | Admitting: Family Medicine

## 2022-03-30 DIAGNOSIS — E785 Hyperlipidemia, unspecified: Secondary | ICD-10-CM

## 2022-04-23 ENCOUNTER — Other Ambulatory Visit: Payer: Self-pay | Admitting: Family Medicine

## 2022-04-23 DIAGNOSIS — E785 Hyperlipidemia, unspecified: Secondary | ICD-10-CM

## 2022-06-01 ENCOUNTER — Ambulatory Visit: Payer: Managed Care, Other (non HMO) | Admitting: Internal Medicine

## 2022-06-01 ENCOUNTER — Encounter: Payer: Self-pay | Admitting: Internal Medicine

## 2022-06-01 VITALS — BP 132/86 | HR 89 | Temp 98.7°F | Resp 18 | Ht 70.5 in | Wt 242.2 lb

## 2022-06-01 DIAGNOSIS — J069 Acute upper respiratory infection, unspecified: Secondary | ICD-10-CM | POA: Diagnosis not present

## 2022-06-01 DIAGNOSIS — J029 Acute pharyngitis, unspecified: Secondary | ICD-10-CM

## 2022-06-01 MED ORDER — AZITHROMYCIN 250 MG PO TABS: ORAL_TABLET | ORAL | 0 refills | Status: DC

## 2022-06-01 NOTE — Patient Instructions (Signed)
Continue taking DayQuil, NyQuil.  For nasal congestion you can also use a allergy nasal spray called Astepro: 2 sprays on each side of the nose twice daily until better  Drink plenty of fluids  If you are no better in the next 2 to 3-day, start an antibiotic called Zithromax.  See prescription.  Call if you get worse or you are not gradually better

## 2022-06-01 NOTE — Progress Notes (Unsigned)
   Subjective:    Patient ID: Vincent Course., male    DOB: [birth year], 47 y.o.   MRN: [id number]  DOS:  06/01/2022 Type of visit - description: Acute  Symptoms started approximately a week ago: Had a sore throat at least moderate in intensity, slightly better now. Sinus congestion, chest congestion, cough. Has seen small amounts of nasal discharge and sputum, greenish in color. Home test for COVID was negative.  Had a low-grade temperature 2 days ago but nothing since then Denies myalgias.  No nausea or vomiting.   Review of Systems See above   Past Medical History:  Diagnosis Date   GERD (gastroesophageal reflux disease)    with certain foods   Hyperlipidemia    on meds   Seasonal allergies    Torn meniscus    right leg    Past Surgical History:  Procedure Laterality Date   MASS EXCISION  04/20/2005   from the left thumb   MENISCUS REPAIR Right 2019   SHOULDER SURGERY Left 04/20/2013   dr Theda Sers   VASECTOMY      Current Outpatient Medications  Medication Instructions   fenofibrate 160 mg, Oral, Daily   rosuvastatin (CRESTOR) 20 mg, Oral, Daily at bedtime       Objective:   Physical Exam BP 132/86   Pulse 89   Temp 98.7 F (37.1 C) (Oral)   Resp 18   Ht 5' 10.5" (1.791 m)   Wt 242 lb 4 oz (109.9 kg)   SpO2 98%   BMI 34.27 kg/m  General:   Well developed, NAD, BMI noted. HEENT:  Normocephalic . Face symmetric, atraumatic. TMs: Normal Nose congested.  Throat: Symmetric, minimal redness, no white discharge or patches. Lungs:  CTA B Normal respiratory effort, no intercostal retractions, no accessory muscle use. Heart: RRR,  no murmur.  Lower extremities: no pretibial edema bilaterally  Skin: Not pale. Not jaundice Neurologic:  alert & oriented X3.  Speech normal, gait appropriate for age and unassisted Psych--  Cognition and judgment appear intact.  Cooperative with normal attention span and concentration.  Behavior appropriate. No  anxious or depressed appearing.      Assessment     47 year old male, PMH includes dyslipidemia, allergies, GERD, presents with:  URI, pharyngitis: Symptoms started a week ago, COVID-negative x 1 at home, nontoxic-appearing. At the beginning sore throat was a prominent symptom, now reports greenish nasal discharge and sputum. Plan: Continue DayQuil and NyQuil which she has at home. Add Astepro as needed for nasal congestion Check strep culture. Call if not gradually better. Pt is adamant about getting antibiotics, printed Zithromax, to  take if no better in 2 to 3 days.

## 2022-06-03 LAB — CULTURE, GROUP A STREP
MICRO NUMBER:: 14552048
SPECIMEN QUALITY:: ADEQUATE

## 2022-06-23 ENCOUNTER — Other Ambulatory Visit: Payer: Self-pay | Admitting: Family Medicine

## 2022-07-31 ENCOUNTER — Other Ambulatory Visit: Payer: Self-pay | Admitting: Family Medicine

## 2022-08-04 ENCOUNTER — Other Ambulatory Visit: Payer: Self-pay | Admitting: Family Medicine

## 2022-08-16 ENCOUNTER — Other Ambulatory Visit: Payer: Self-pay | Admitting: Family Medicine

## 2022-08-25 ENCOUNTER — Other Ambulatory Visit: Payer: Self-pay | Admitting: Family Medicine

## 2022-11-03 ENCOUNTER — Other Ambulatory Visit: Payer: Self-pay | Admitting: Family Medicine

## 2022-12-11 ENCOUNTER — Other Ambulatory Visit: Payer: Self-pay | Admitting: Family Medicine

## 2022-12-11 DIAGNOSIS — E785 Hyperlipidemia, unspecified: Secondary | ICD-10-CM

## 2022-12-15 ENCOUNTER — Encounter: Payer: Self-pay | Admitting: Family Medicine

## 2022-12-15 ENCOUNTER — Ambulatory Visit: Payer: Managed Care, Other (non HMO) | Admitting: Family Medicine

## 2022-12-15 VITALS — BP 118/80 | HR 89 | Temp 98.9°F | Resp 18 | Ht 70.5 in | Wt 231.4 lb

## 2022-12-15 DIAGNOSIS — D229 Melanocytic nevi, unspecified: Secondary | ICD-10-CM | POA: Diagnosis not present

## 2022-12-15 DIAGNOSIS — E785 Hyperlipidemia, unspecified: Secondary | ICD-10-CM | POA: Diagnosis not present

## 2022-12-15 DIAGNOSIS — R739 Hyperglycemia, unspecified: Secondary | ICD-10-CM | POA: Diagnosis not present

## 2022-12-15 DIAGNOSIS — F419 Anxiety disorder, unspecified: Secondary | ICD-10-CM | POA: Diagnosis not present

## 2022-12-15 MED ORDER — FENOFIBRATE 160 MG PO TABS: 160.0000 mg | ORAL_TABLET | Freq: Every day | ORAL | 1 refills | Status: DC

## 2022-12-15 MED ORDER — ROSUVASTATIN CALCIUM 20 MG PO TABS
20.0000 mg | ORAL_TABLET | Freq: Every day | ORAL | 1 refills | Status: DC
Start: 2022-12-15 — End: 2023-07-05

## 2022-12-15 MED ORDER — VENLAFAXINE HCL ER 37.5 MG PO CP24: 37.5000 mg | ORAL_CAPSULE | Freq: Every day | ORAL | 2 refills | Status: DC

## 2022-12-15 NOTE — Progress Notes (Unsigned)
Established Patient Office Visit  Subjective   Patient ID: Vincent Pollard., male    DOB: 10/17/1975  Age: 47 y.o. MRN: [id number]  Chief Complaint  Patient presents with   Hyperlipidemia   Follow-up   Nevus    Pt's wife has noticed a mole on his back that has changed size    HPI Discussed the use of AI scribe software for clinical note transcription with the patient, who gave verbal consent to proceed.  History of Present Illness   The patient, with a history of hyperlipidemia, presents for a routine follow-up. He reports that his wife has noticed that he has been experiencing symptoms of anxiety, including quick frustration and anger. The patient acknowledges that he does get "worked up" easily, particularly when watching his daughter play basketball. He denies any violent behavior or legal issues related to his anger. He also reports a mole on his back that has been growing, as noticed by his wife. Additionally, he has a skin tag near his eye that is affecting his vision.      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   Preventative health care 11/25/2017   Chest pain 09/26/2015   Hyperlipidemia 09/26/2015   Obesity (BMI 30-39.9) 06/08/2013   Left shoulder pain 06/08/2013   Past Medical History:  Diagnosis Date   GERD (gastroesophageal reflux disease)    with certain foods   Hyperlipidemia    on meds   Seasonal allergies    Torn meniscus    right leg   Past Surgical History:  Procedure Laterality Date   MASS EXCISION  04/20/2005   from the left thumb   MENISCUS REPAIR Right 2019   SHOULDER SURGERY Left 04/20/2013   dr Thomasena Edis   VASECTOMY     Social History   Tobacco Use   Smoking status: Never   Smokeless tobacco: Never  Vaping Use   Vaping status: Never Used  Substance Use Topics   Alcohol use: Yes    Comment: once per month   Drug use: No   Social History   Socioeconomic History   Marital status: Married     Spouse name: Not on file   Number of children: Not on file   Years of education: Not on file   Highest education level: Not on file  Occupational History   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    Occupation: mobile detail    Comment: self employed  Tobacco Use   Smoking status: Never   Smokeless tobacco: Never  Vaping Use   Vaping status: Never Used  Substance and Sexual Activity   Alcohol use: Yes    Comment: once per month   Drug use: No   Sexual activity: Yes    Partners: Female  Other Topics Concern   Not on file  Social History Narrative   Exercise--- walking 4 days a weeks for 40 min    Social Determinants of Health   Financial Resource Strain: Not on file  Food Insecurity: Not on file  Transportation Needs: Not on file  Physical Activity: Not on file  Stress: Not on file  Social Connections: Unknown (10/26/2021)   Received from [facility]   Social Network    Social Network: Not on file  Intimate Partner Violence: Unknown (10/26/2021)   Received from [facility]   HITS    Physically Hurt: Not on file  Insult or Talk Down To: Not on file    Threaten Physical Harm: Not on file    Scream or Curse: Not on file   Family Status  Relation Name Status   Mother  Alive   Father  Alive   Sister  Alive   MGM  Alive   MGF  Deceased   PGM  Alive   PGF  Alive   Pat Uncle  Alive   Dennie Bible Uncle  Deceased at age 30   Neg Hx  (Not Specified)  No partnership data on file   Family History  Problem Relation Age of Onset   Hypertension Mother    Diabetes Mother    Colon polyps Father 64   Hypertension Father    Prostate cancer Father    Diabetes Sister    Hypertension Sister    Polycystic ovary syndrome Sister    Diabetes Maternal Grandmother    AAA (abdominal aortic aneurysm) Maternal Grandmother    Stroke Maternal Grandfather    Diabetes Maternal Grandfather    Heart disease Maternal Grandfather    Arthritis  Paternal Grandmother    Colon cancer Paternal Grandfather 68   Colon polyps Paternal Grandfather 24   Arthritis Paternal Grandfather    Prostate cancer Paternal Grandfather 30   Prostate cancer Paternal Uncle    Prostate cancer Paternal Uncle    Esophageal cancer Neg Hx    Rectal cancer Neg Hx    Stomach cancer Neg Hx    Allergies  Allergen Reactions   Guaifenesin Other (See Comments)    Insomnia   Penicillins Other (See Comments)    Childhood reaction unknown      Review of Systems  Constitutional:  Negative for fever and malaise/fatigue.  HENT:  Negative for congestion.   Eyes:  Negative for blurred vision.  Respiratory:  Negative for cough and shortness of breath.   Cardiovascular:  Negative for chest pain, palpitations and leg swelling.  Gastrointestinal:  Negative for vomiting.  Musculoskeletal:  Negative for back pain.  Skin:  Negative for rash.  Neurological:  Negative for loss of consciousness and headaches.      Objective:     BP 118/80 (BP Location: Right Arm, Patient Position: Sitting, Cuff Size: Large)   Pulse 89   Temp 98.9 F (37.2 C) (Oral)   Resp 18   Ht 5' 10.5" (1.791 m)   Wt 231 lb 6.4 oz (105 kg)   SpO2 96%   BMI 32.73 kg/m  BP Readings from Last 3 Encounters:  12/15/22 118/80  06/01/22 132/86  09/29/21 110/80   Wt Readings from Last 3 Encounters:  12/15/22 231 lb 6.4 oz (105 kg)  06/01/22 242 lb 4 oz (109.9 kg)  09/29/21 238 lb 9.6 oz (108.2 kg)   SpO2 Readings from Last 3 Encounters:  12/15/22 96%  06/01/22 98%  09/29/21 98%      Physical Exam Vitals and nursing note reviewed.  Constitutional:      General: He is not in acute distress.    Appearance: Normal appearance. He is well-developed.  HENT:     Head: Normocephalic and atraumatic.  Eyes:     General: No scleral icterus.       Right eye: No discharge.        Left eye: No discharge.  Cardiovascular:     Rate and Rhythm: Normal rate and regular rhythm.     Heart  sounds: No murmur heard. Pulmonary:     Effort: Pulmonary effort is  normal. No respiratory distress.     Breath sounds: Normal breath sounds.  Musculoskeletal:        General: Normal range of motion.     Cervical back: Normal range of motion and neck supple.     Right lower leg: No edema.     Left lower leg: No edema.  Skin:    General: Skin is warm and dry.  Neurological:     Mental Status: He is alert and oriented to person, place, and time.  Psychiatric:        Mood and Affect: Mood normal.        Behavior: Behavior normal.        Thought Content: Thought content normal.        Judgment: Judgment normal.      No results found for any visits on 12/15/22.  Last CBC Lab Results  Component Value Date   WBC 4.7 09/29/2021   HGB 15.2 09/29/2021   HCT 45.3 09/29/2021   MCV 89.4 09/29/2021   RDW 13.3 09/29/2021   PLT 258.0 09/29/2021   Last metabolic panel Lab Results  Component Value Date   GLUCOSE 135 (H) 09/29/2021   NA 139 09/29/2021   K 4.5 09/29/2021   CL 102 09/29/2021   CO2 29 09/29/2021   BUN 11 09/29/2021   CREATININE 1.19 09/29/2021   GFR 72.91 09/29/2021   CALCIUM 10.1 09/29/2021   PROT 7.3 09/29/2021   ALBUMIN 4.7 09/29/2021   BILITOT 0.6 09/29/2021   ALKPHOS 30 (L) 09/29/2021   AST 19 09/29/2021   ALT 33 09/29/2021   Last lipids Lab Results  Component Value Date   CHOL 223 (H) 09/29/2021   HDL 43.00 09/29/2021   LDLCALC 157 (H) 09/29/2021   LDLDIRECT 213.0 11/29/2018   TRIG 117.0 09/29/2021   CHOLHDL 5 09/29/2021   Last hemoglobin A1c Lab Results  Component Value Date   HGBA1C 6.3 09/29/2021   Last thyroid functions Lab Results  Component Value Date   TSH 1.78 09/29/2021   Last vitamin D No results found for: "25OHVITD2", "25OHVITD3", "VD25OH" Last vitamin B12 and Folate No results found for: "VITAMINB12", "FOLATE"    The 10-year ASCVD risk score (Arnett DK, et al., 2019) is: 4.5%    Assessment & Plan:   Problem List Items  Addressed This Visit       Unprioritized   Hyperlipidemia   Relevant Medications   fenofibrate 160 MG tablet   rosuvastatin (CRESTOR) 20 MG tablet   Other Relevant Orders   CBC with Differential/Platelet   Comprehensive metabolic panel   Lipid panel   TSH   Other Visit Diagnoses     Hyperglycemia    -  Primary   Relevant Orders   Comprehensive metabolic panel   Lipid panel   Anxiety       Relevant Medications   venlafaxine XR (EFFEXOR XR) 37.5 MG 24 hr capsule   Suspicious nevus       Relevant Orders   Ambulatory referral to Dermatology     Assessment and Plan    Anxiety Reports of quick frustration and anger, particularly in high-stress situations. No history of violence or harm to others. Discussed the potential benefits of medication to help manage these symptoms. -Start Effexor at the lowest dose (37.5mg  daily) and monitor for tolerance and effectiveness. -Follow-up in one month via virtual visit to assess response to medication.  Skin Lesions Noted growth of a mole on the back and a skin tag near  the eye that is affecting vision. Both lesions have changed recently. -Refer to dermatology for evaluation of both lesions. -Instructed patient to emphasize to dermatologist that the skin tag is blocking vision and the mole on the back has grown in size.  Hyperlipidemia Continues on fenofibrate for management. -Continue current medication regimen.  General Health Maintenance -Order routine blood work today.        No follow-ups on file.    Donato Schultz, DO

## 2022-12-16 LAB — LIPID PANEL
Cholesterol: 221 mg/dL — ABNORMAL HIGH (ref 0–200)
HDL: 39.2 mg/dL (ref >39.00)
LDL Cholesterol: 148 mg/dL — ABNORMAL HIGH (ref 0–99)
NonHDL: 181.41
Total CHOL/HDL Ratio: 6
Triglycerides: 168 mg/dL — ABNORMAL HIGH (ref 0.0–149.0)
VLDL: 33.6 mg/dL (ref 0.0–40.0)

## 2022-12-16 LAB — CBC WITH DIFFERENTIAL/PLATELET
Basophils Absolute: 0 10*3/uL (ref 0.0–0.1)
Basophils Relative: 0.6 % (ref 0.0–3.0)
Eosinophils Absolute: 0.2 10*3/uL (ref 0.0–0.7)
Eosinophils Relative: 3.1 % (ref 0.0–5.0)
HCT: 44 % (ref 39.0–52.0)
Hemoglobin: 14.4 g/dL (ref 13.0–17.0)
Lymphocytes Relative: 31.5 % (ref 12.0–46.0)
Lymphs Abs: 1.7 10*3/uL (ref 0.7–4.0)
MCHC: 32.7 g/dL (ref 30.0–36.0)
MCV: 90.2 fl (ref 78.0–100.0)
Monocytes Absolute: 0.6 10*3/uL (ref 0.1–1.0)
Monocytes Relative: 10.9 % (ref 3.0–12.0)
Neutro Abs: 2.9 10*3/uL (ref 1.4–7.7)
Neutrophils Relative %: 53.9 % (ref 43.0–77.0)
Platelets: 281 10*3/uL (ref 150.0–400.0)
RBC: 4.87 Mil/uL (ref 4.22–5.81)
RDW: 13.1 % (ref 11.5–15.5)
WBC: 5.5 10*3/uL (ref 4.0–10.5)

## 2022-12-16 LAB — COMPREHENSIVE METABOLIC PANEL
ALT: 30 U/L (ref 0–53)
AST: 21 U/L (ref 0–37)
Albumin: 4.7 g/dL (ref 3.5–5.2)
Alkaline Phosphatase: 30 U/L — ABNORMAL LOW (ref 39–117)
BUN: 18 mg/dL (ref 6–23)
CO2: 27 meq/L (ref 19–32)
Calcium: 10.3 mg/dL (ref 8.4–10.5)
Chloride: 105 meq/L (ref 96–112)
Creatinine, Ser: 1.38 mg/dL (ref 0.40–1.50)
GFR: 60.52 mL/min (ref >60.00)
Glucose, Bld: 99 mg/dL (ref 70–99)
Potassium: 4 meq/L (ref 3.5–5.1)
Sodium: 142 meq/L (ref 135–145)
Total Bilirubin: 0.6 mg/dL (ref 0.2–1.2)
Total Protein: 7.3 g/dL (ref 6.0–8.3)

## 2022-12-16 LAB — TSH: TSH: 1.36 u[IU]/mL (ref 0.35–5.50)

## 2022-12-28 IMAGING — CT CT RENAL STONE PROTOCOL
2 of 4 series · 16 of 46 positions shown, 18 images · non-contrast
Comparison: None.

CLINICAL DATA: Flank pain

Suspect kidney stone
Hematuria
EXAM:
CT ABDOMEN AND PELVIS WITHOUT CONTRAST
TECHNIQUE: Multidetector CT imaging of the abdomen and pelvis was performed
following the standard protocol without IV contrast.

[Series 2: axial st · axial · 0.97mm/px · z∈[-531,-26]mm · 13 of 111 slices shown, 15 images]
[im 5/111  soft-tissue]
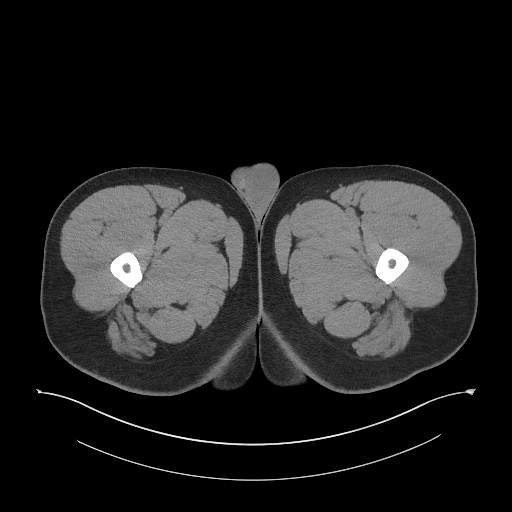
[im 5/111  bone]
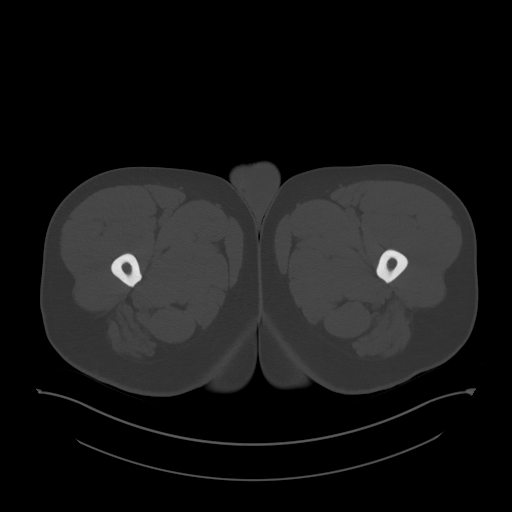
[im 14/111  soft-tissue]
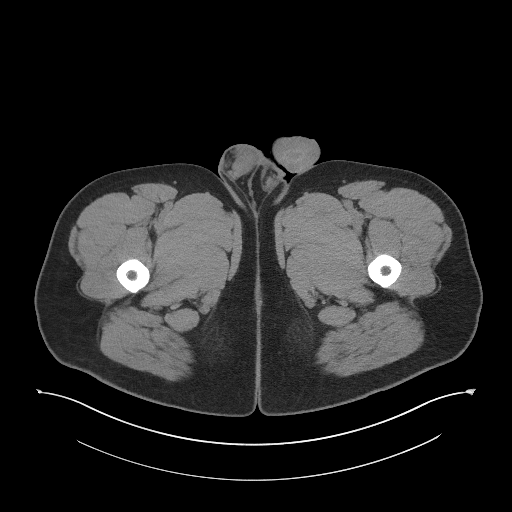
[im 23/111  soft-tissue]
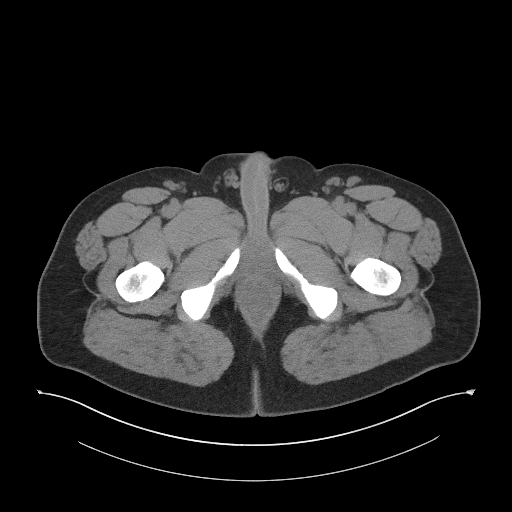
[im 31/111  soft-tissue]
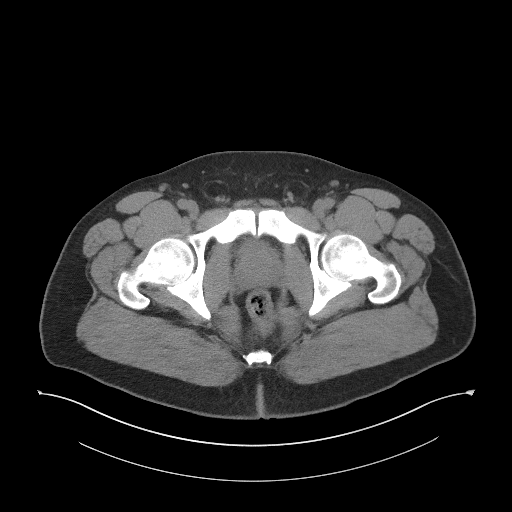
[im 40/111  soft-tissue]
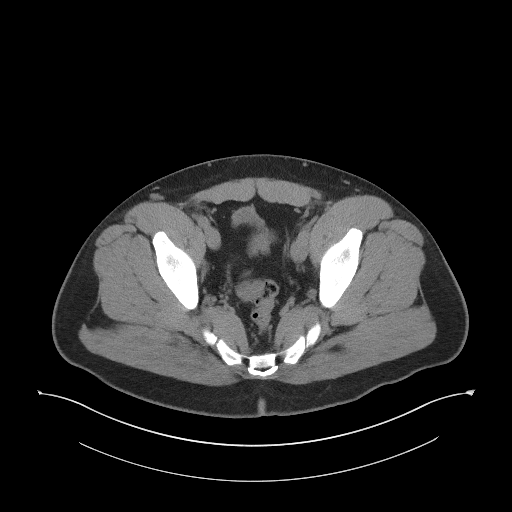
[im 49/111  soft-tissue]
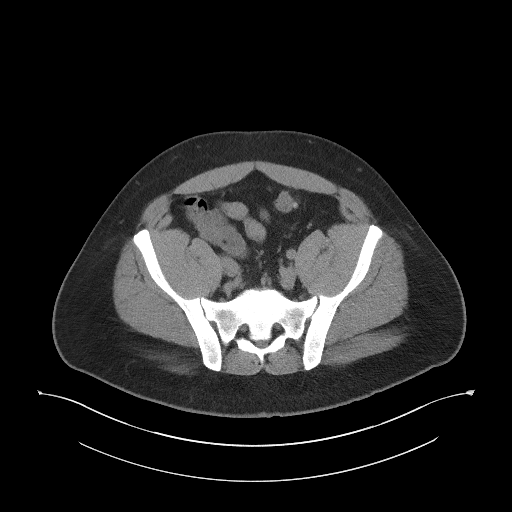
[im 58/111  soft-tissue]
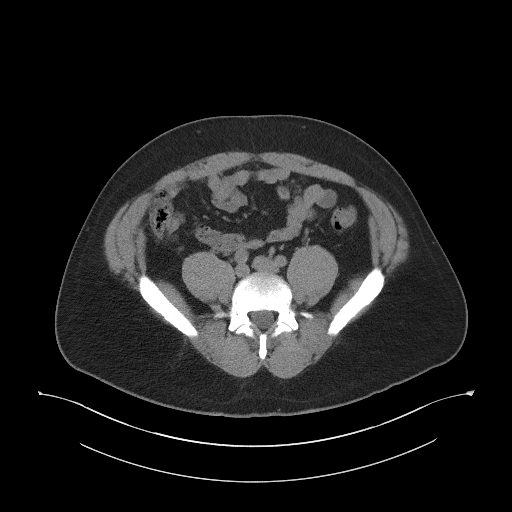
[im 62/111  soft-tissue]
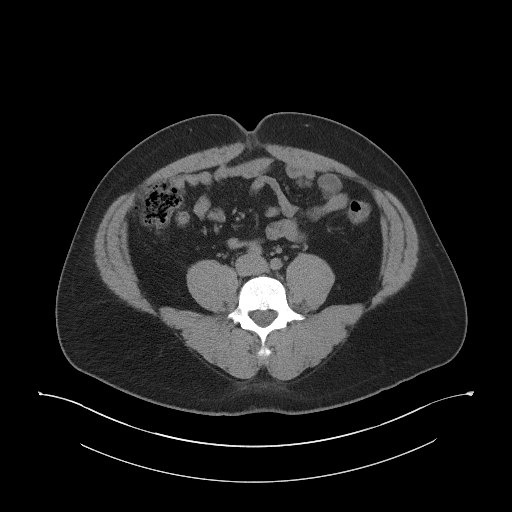
[im 71/111  soft-tissue]
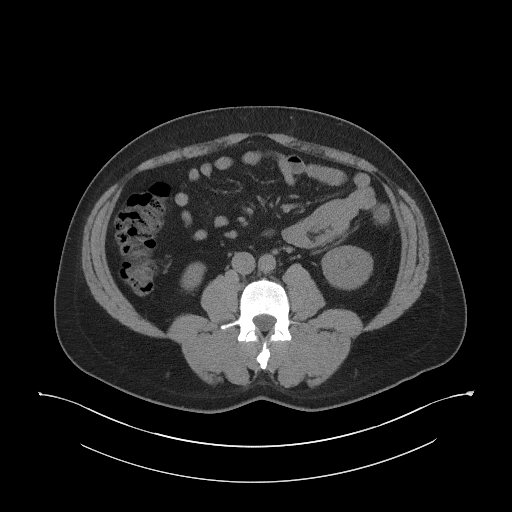
[im 71/111  bone]
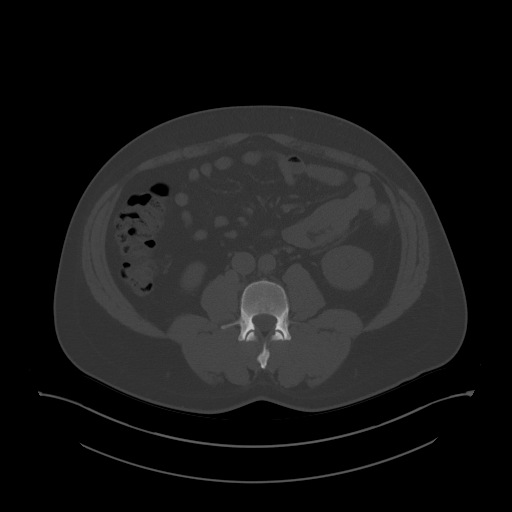
[im 80/111  soft-tissue]
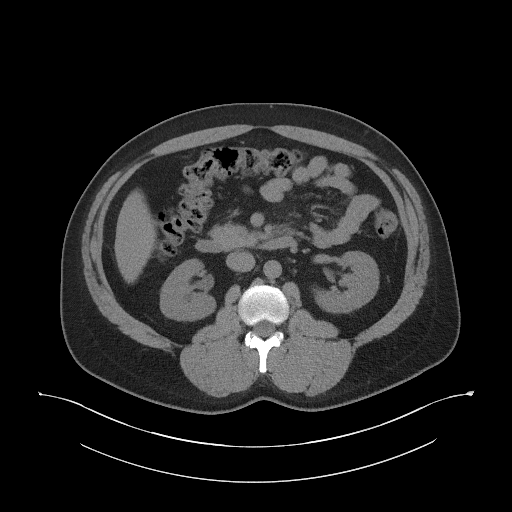
[im 89/111  soft-tissue]
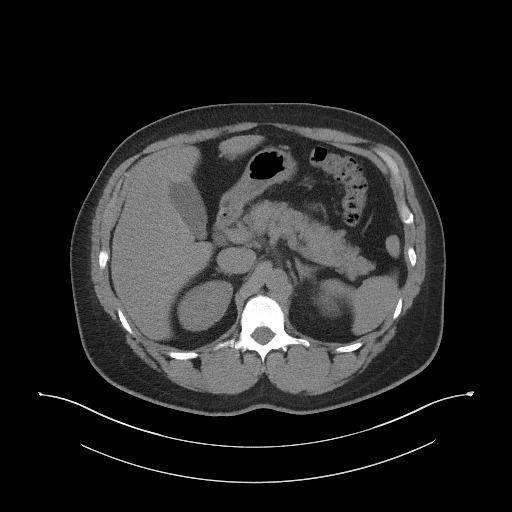
[im 97/111  soft-tissue]
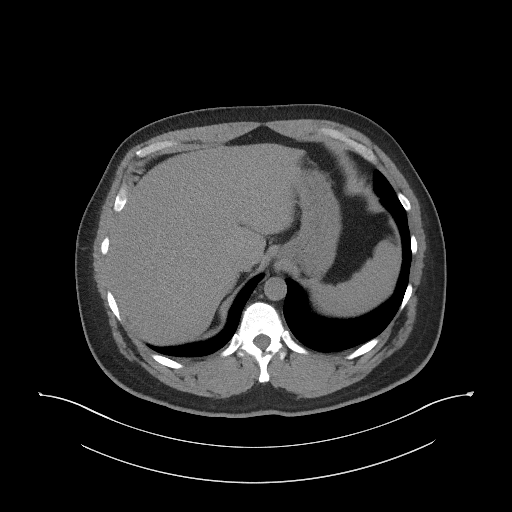
[im 106/111  soft-tissue]
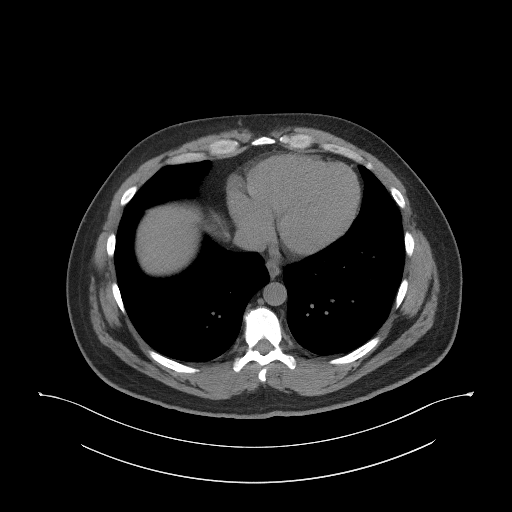

[Series 5: coronal st · coronal · 0.90mm/px · 3 of 103 slices shown]
[im 35/103  soft-tissue]
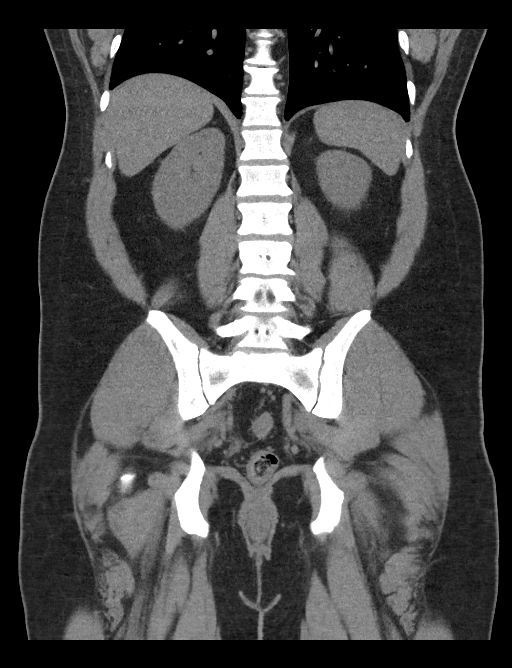
[im 46/103  soft-tissue]
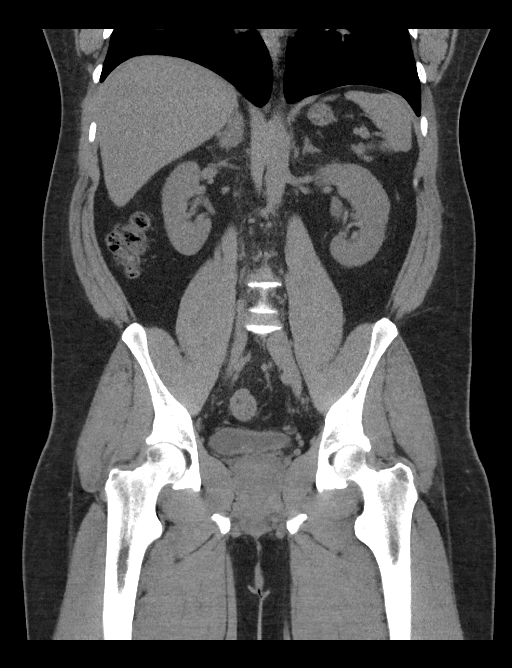
[im 57/103  soft-tissue]
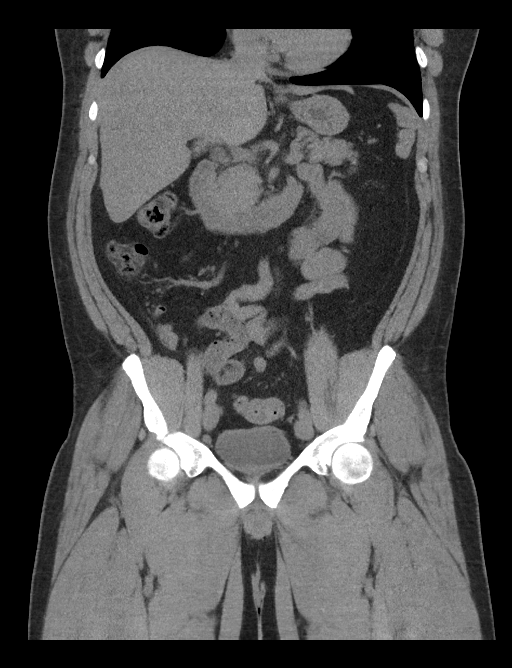

[16 of 46 positions shown; findings below may reference images not displayed]

FINDINGS: Lower chest: No acute abnormality.

Hepatobiliary: No focal liver abnormality is seen. No gallstones,
gallbladder wall thickening, or biliary dilatation.

Pancreas: Unremarkable. No pancreatic ductal dilatation or
surrounding inflammatory changes.

Spleen: Normal in size without focal abnormality.

Adrenals/Urinary Tract: Adrenal glands normal in appearance. 3 mm
calculus noted at the right ureterovesicular junction without
significant hydronephrosis or hydroureter. 1.5 cm simple cyst seen
in the medial left kidney. Kidneys, ureters, and bladder otherwise
unremarkable.

Stomach/Bowel: Minimal sigmoid colon diverticulosis without evidence
of acute diverticulitis. Normal appendix. Stomach and bowel
otherwise unremarkable.

Vascular/Lymphatic: No significant vascular findings are present. No
enlarged abdominal or pelvic lymph nodes.

Reproductive: Uterus and bilateral adnexa are unremarkable.

Other: Small fat containing umbilical and bilateral inguinal
hernias.

Musculoskeletal: No acute or significant osseous findings.
IMPRESSION: 3 mm calculus noted at the right ureterovesicular junction without
significant hydronephrosis or hydroureter.

## 2023-01-06 ENCOUNTER — Other Ambulatory Visit: Payer: Self-pay | Admitting: Family Medicine

## 2023-01-06 DIAGNOSIS — F419 Anxiety disorder, unspecified: Secondary | ICD-10-CM

## 2023-03-01 ENCOUNTER — Encounter: Payer: Self-pay | Admitting: Dermatology

## 2023-03-01 ENCOUNTER — Ambulatory Visit (INDEPENDENT_AMBULATORY_CARE_PROVIDER_SITE_OTHER): Payer: Managed Care, Other (non HMO) | Admitting: Dermatology

## 2023-03-01 VITALS — BP 134/79

## 2023-03-01 DIAGNOSIS — D225 Melanocytic nevi of trunk: Secondary | ICD-10-CM | POA: Diagnosis not present

## 2023-03-01 DIAGNOSIS — D492 Neoplasm of unspecified behavior of bone, soft tissue, and skin: Secondary | ICD-10-CM | POA: Diagnosis not present

## 2023-03-01 DIAGNOSIS — D22111 Melanocytic nevi of right upper eyelid, including canthus: Secondary | ICD-10-CM | POA: Diagnosis not present

## 2023-03-01 DIAGNOSIS — D229 Melanocytic nevi, unspecified: Secondary | ICD-10-CM

## 2023-03-01 DIAGNOSIS — L821 Other seborrheic keratosis: Secondary | ICD-10-CM

## 2023-03-01 DIAGNOSIS — D485 Neoplasm of uncertain behavior of skin: Secondary | ICD-10-CM

## 2023-03-01 DIAGNOSIS — D2239 Melanocytic nevi of other parts of face: Secondary | ICD-10-CM

## 2023-03-01 NOTE — Patient Instructions (Addendum)

## 2023-03-01 NOTE — Progress Notes (Signed)
   New Patient Visit   Subjective  Vincent Pollard. is a 47 y.o. male who presents for the following: Moles of face and back that he would like checked. Pt has no hx of skin cancer but possible family hx of NMSC  The following portions of the chart were reviewed this encounter and updated as appropriate: medications, allergies, medical history  Review of Systems:  No other skin or systemic complaints except as noted in HPI or Assessment and Plan.  Objective  Well appearing patient in no apparent distress; mood and affect are within normal limits.   A focused examination was performed of the following areas: Face, back   Relevant exam findings are noted in the Assessment and Plan.  Right lat glabella 5 mm brown black papule  Right Medial Canthus 7 mm pedunculated flesh colored papule          Assessment & Plan     Neoplasm of uncertain behavior of skin (2) Right lat glabella  Skin / nail biopsy Type of biopsy: tangential   Informed consent: discussed and consent obtained   Timeout: patient name, date of birth, surgical site, and procedure verified   Procedure prep:  Patient was prepped and draped in usual sterile fashion Prep type:  Isopropyl alcohol Anesthesia: the lesion was anesthetized in a standard fashion   Anesthetic:  1% lidocaine w/ epinephrine 1-100,000 buffered w/ 8.4% NaHCO3 Instrument used: flexible razor blade   Hemostasis achieved with: pressure, aluminum chloride and electrodesiccation   Outcome: patient tolerated procedure well   Post-procedure details: sterile dressing applied and wound care instructions given   Dressing type: bandage and petrolatum    Specimen 1 - Surgical pathology Differential Diagnosis: R/O IDN vs BCC  Check Margins: No  Right Medial Canthus  Skin / nail biopsy Type of biopsy: tangential   Informed consent: discussed and consent obtained   Timeout: patient name, date of birth, surgical site, and procedure  verified   Procedure prep:  Patient was prepped and draped in usual sterile fashion Prep type:  Isopropyl alcohol Anesthesia: the lesion was anesthetized in a standard fashion   Anesthetic:  1% lidocaine w/ epinephrine 1-100,000 buffered w/ 8.4% NaHCO3 Instrument used: flexible razor blade   Hemostasis achieved with: pressure, aluminum chloride and electrodesiccation   Outcome: patient tolerated procedure well   Post-procedure details: sterile dressing applied and wound care instructions given   Dressing type: bandage and petrolatum    Specimen 2 - Surgical pathology Differential Diagnosis: R/O IDN vs BCC  Check Margins: No   SEBORRHEIC KERATOSIS- Back and face - Stuck-on, waxy, tan-brown papules and/or plaques  - Benign-appearing - Discussed benign etiology and prognosis. - Observe - Call for any changes  MELANOCYTIC NEVUS Exam: Tan-brown and/or pink-flesh-colored symmetric macule of lower right back.  Treatment Plan: Benign appearing on exam today. Recommend observation. Call clinic for new or changing moles. Recommend daily use of broad spectrum spf 30+ sunscreen to sun-exposed areas.   DPN Exam: Brown papules of face.  Treatment Plan: Benign-appearing.  Observation.  Call clinic for new or changing lesions.  Recommend daily use of broad spectrum spf 30+ sunscreen to sun-exposed areas.    Return in about 6 months (around 08/29/2023) for Follow up.  I, Joanie Coddington, CMA, am acting as scribe for Gwenith Daily, MD .   Documentation: I have reviewed the above documentation for accuracy and completeness, and I agree with the above.  Gwenith Daily, MD

## 2023-03-03 LAB — SURGICAL PATHOLOGY

## 2023-04-16 ENCOUNTER — Other Ambulatory Visit: Payer: Self-pay | Admitting: Family Medicine

## 2023-04-16 DIAGNOSIS — F419 Anxiety disorder, unspecified: Secondary | ICD-10-CM

## 2023-05-15 ENCOUNTER — Other Ambulatory Visit: Payer: Self-pay | Admitting: Family Medicine

## 2023-05-15 DIAGNOSIS — F419 Anxiety disorder, unspecified: Secondary | ICD-10-CM

## 2023-05-27 ENCOUNTER — Other Ambulatory Visit: Payer: Self-pay | Admitting: Family Medicine

## 2023-05-27 DIAGNOSIS — F419 Anxiety disorder, unspecified: Secondary | ICD-10-CM

## 2023-07-05 ENCOUNTER — Other Ambulatory Visit: Payer: Self-pay | Admitting: Family Medicine

## 2023-07-05 DIAGNOSIS — E785 Hyperlipidemia, unspecified: Secondary | ICD-10-CM

## 2023-08-05 ENCOUNTER — Other Ambulatory Visit: Payer: Self-pay | Admitting: Family Medicine

## 2023-08-05 DIAGNOSIS — E785 Hyperlipidemia, unspecified: Secondary | ICD-10-CM

## 2023-08-07 ENCOUNTER — Other Ambulatory Visit: Payer: Self-pay | Admitting: Family Medicine

## 2023-08-07 DIAGNOSIS — E785 Hyperlipidemia, unspecified: Secondary | ICD-10-CM

## 2023-08-30 ENCOUNTER — Ambulatory Visit: Payer: Managed Care, Other (non HMO) | Admitting: Dermatology

## 2023-09-01 IMAGING — US US SCROTUM W/ DOPPLER COMPLETE
1 series · 13 of 25 positions shown · non-contrast
Comparison: None.

CLINICAL DATA: Small nodular right scrotal mass. Evaluate for
testicular mass.

EXAM:
SCROTAL ULTRASOUND
DOPPLER ULTRASOUND OF THE TESTICLES
TECHNIQUE: Complete ultrasound examination of the testicles, epididymis, and
other scrotal structures was performed. Color and spectral Doppler
ultrasound were also utilized to evaluate blood flow to the
testicles.

[Series 1: us scrotum w/ doppler complete · 13 of 48 slices shown]
[im 1/48]
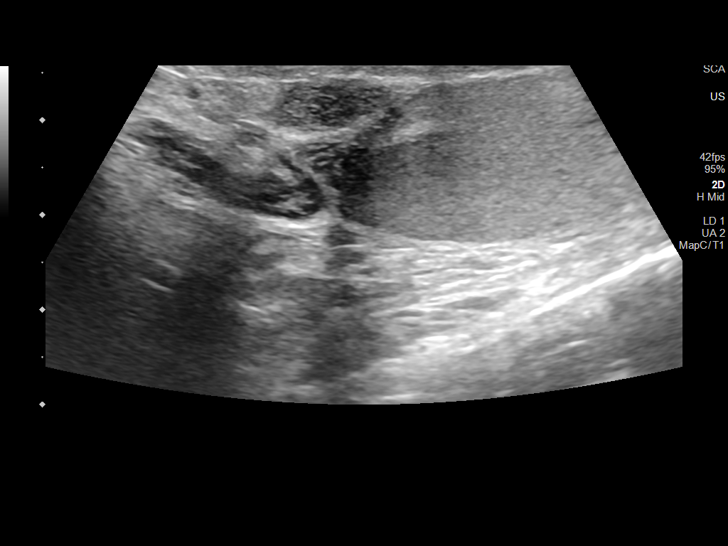
[im 4/48]
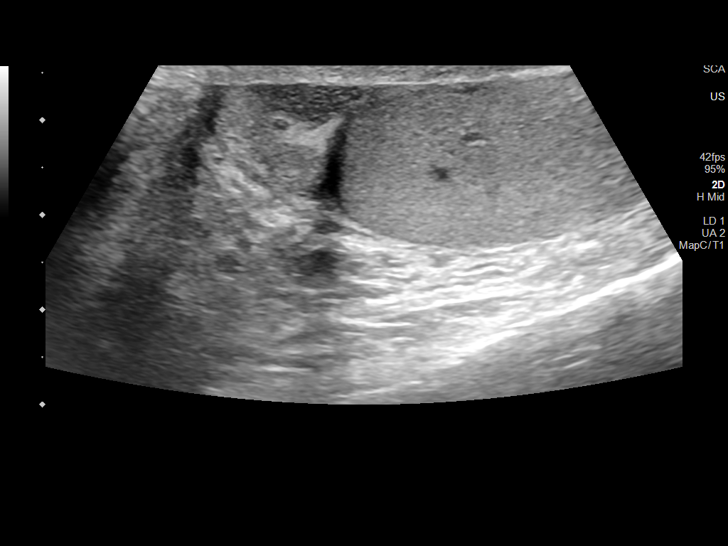
[im 8/48]
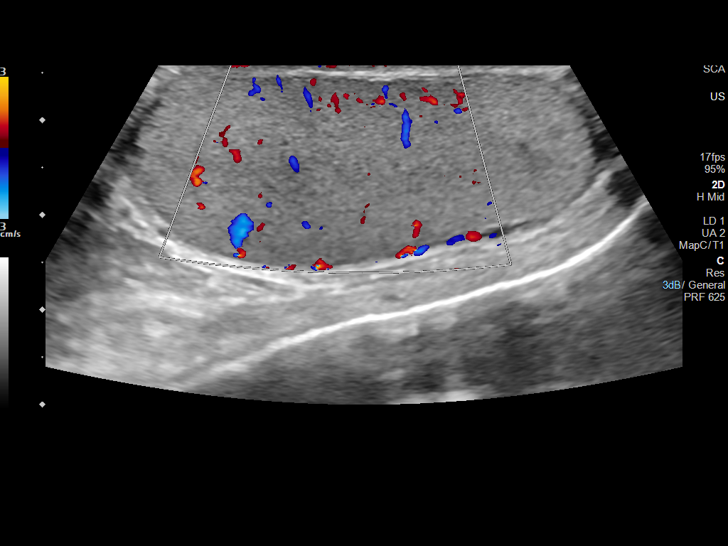
[im 12/48]
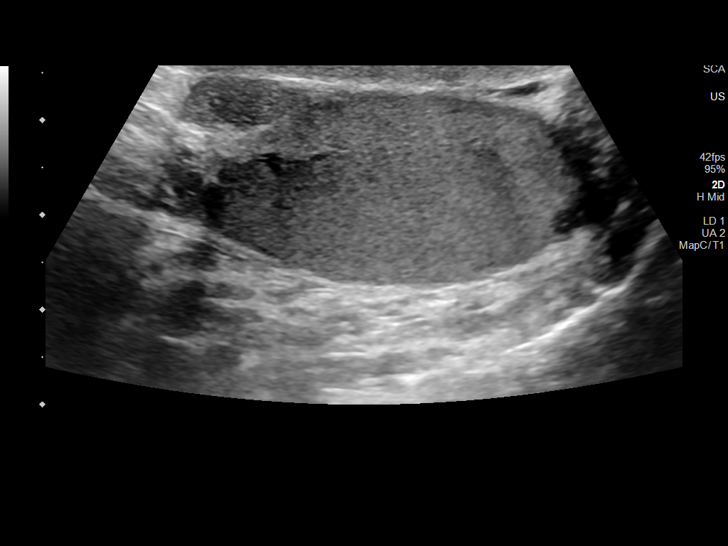
[im 16/48]
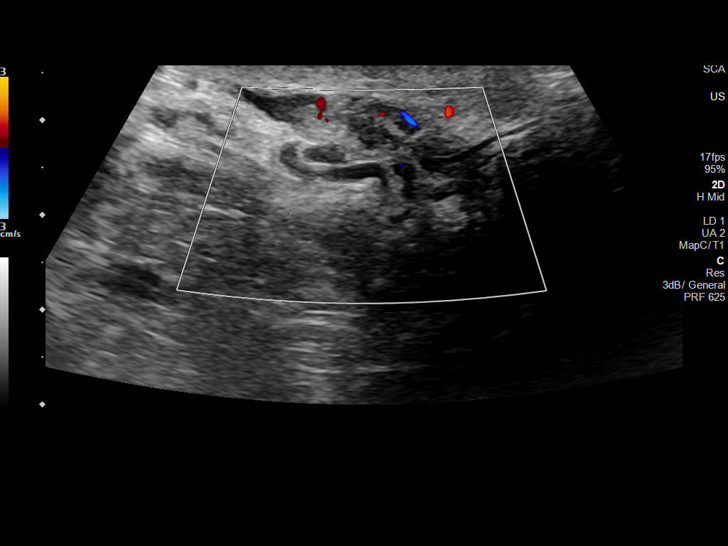
[im 20/48]
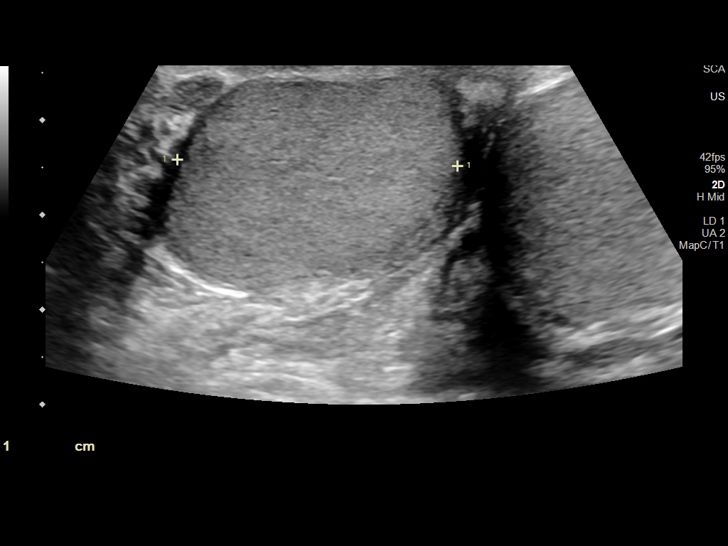
[im 24/48]
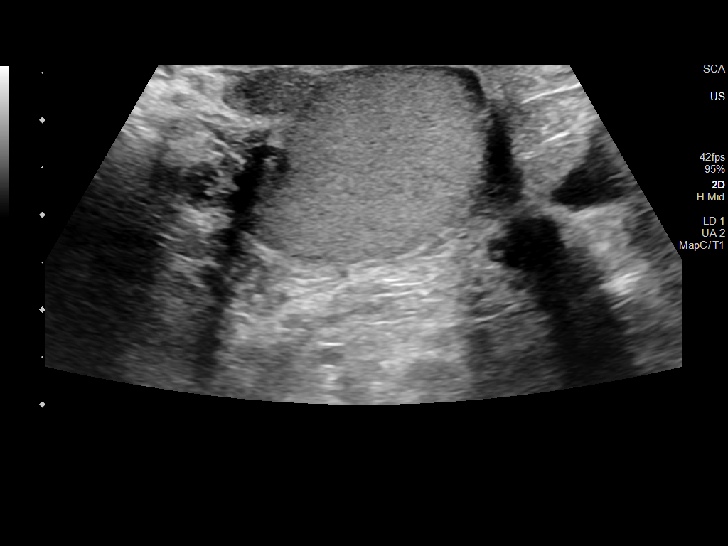
[im 28/48]
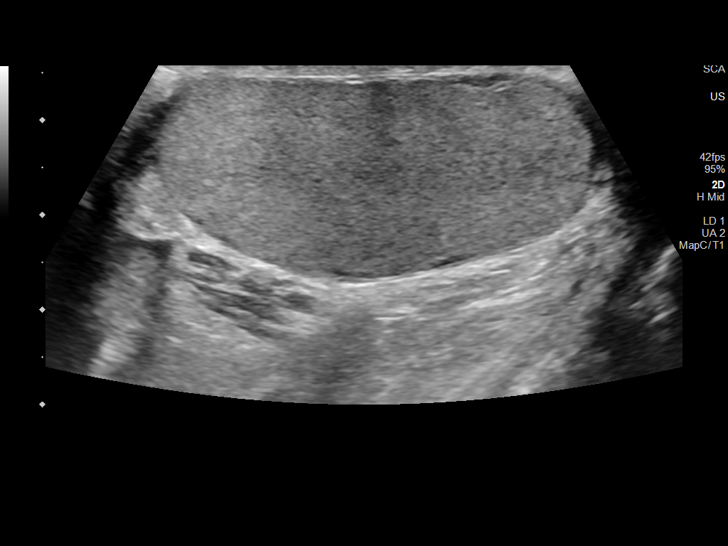
[im 32/48]
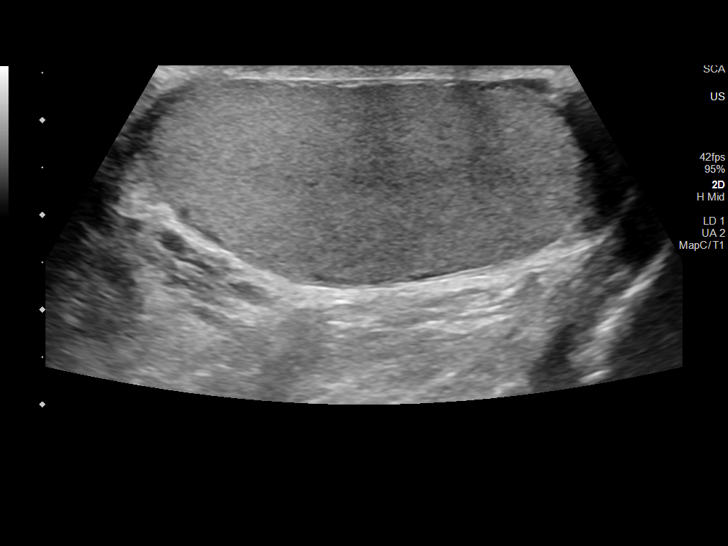
[im 36/48]
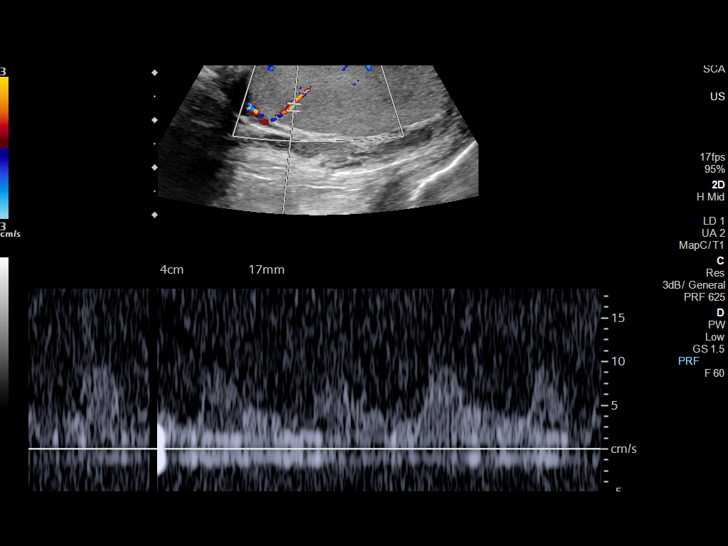
[im 40/48]
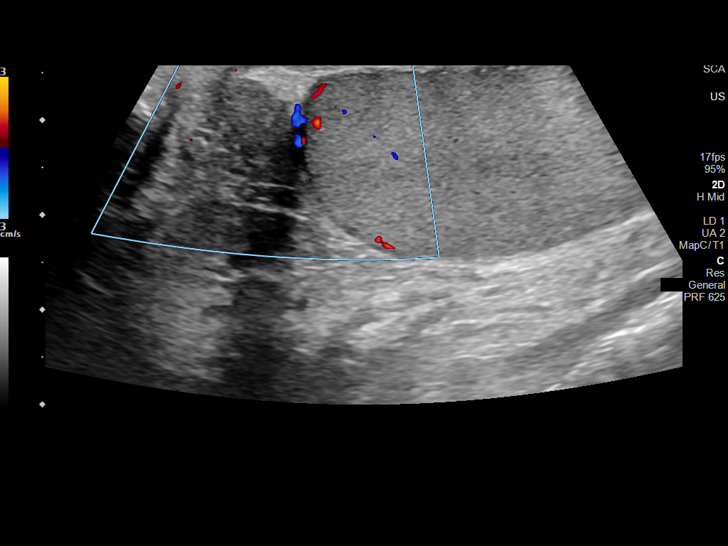
[im 44/48]
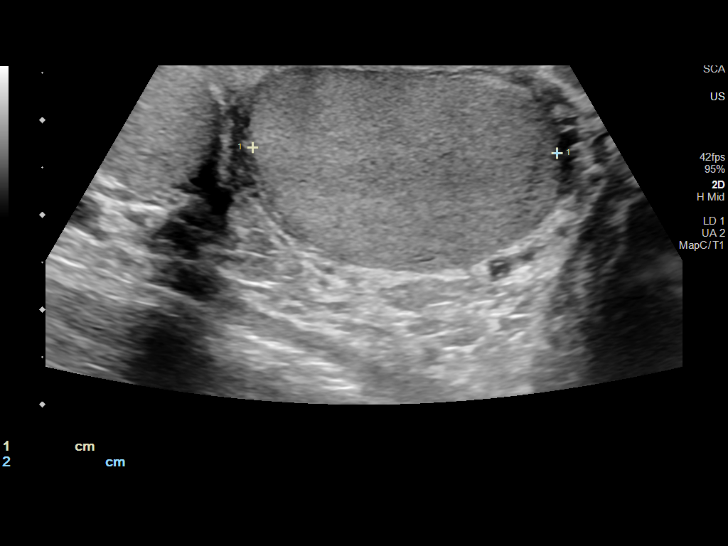
[im 48/48]
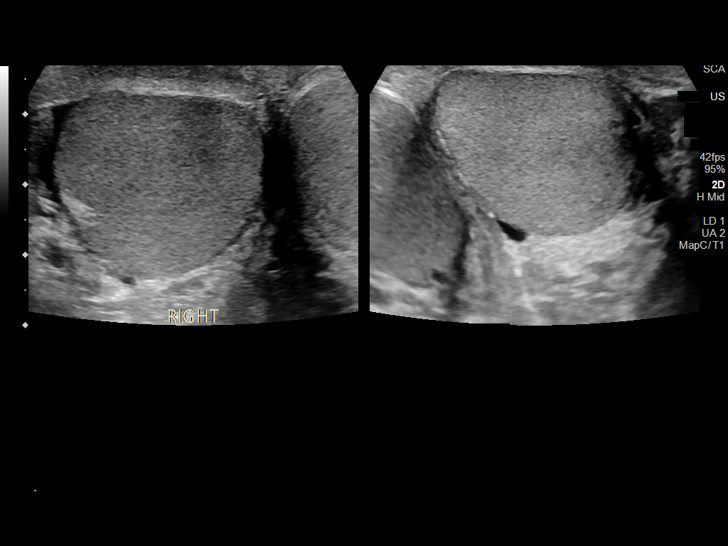

[13 of 25 positions shown; findings below may reference images not displayed]

FINDINGS: Right testicle

Measurements: 4.9 x 1.9 x 3.0 cm. No mass or microlithiasis
visualized.

Left testicle

Measurements: 4.8 x 2.1 x 3.2 cm. No mass or microlithiasis
visualized.

Right epididymis: The right epididymal head underlies the area of
palpable concern. There is a small hypoechoic, apparently solid
lesion in the anterior aspect of the epididymal globus major in this
area, which may correspond to the palpable abnormality. This
measures 9.1 x 3.7 mm on image 6/49. The remainder of the epididymis
is unremarkable. There is trace color flow within this.

Left epididymis:  Normal in size and appearance.

Hydrocele:  Trace bilateral.

Varicocele:  None visualized.

Pulsed Doppler interrogation of both testes demonstrates normal low
resistance arterial and venous waveforms bilaterally.
IMPRESSION: 1. 9.1 x 3.7 mm apparently solid hypoechoic lesion in the anterior
aspect of the head of the right epididymis, with trace color flow,
may correspond to the palpable abnormality and there is no other
significant abnormality on exam. Benign entity is statistically
favored such as an epididymal leiomyoma, adenomatoid tumor, or sperm
granuloma. Urology consult is recommended and a follow-up study is
recommended for stability/recharacterization in 3-6 months.
2. No evidence of testicular mass or torsion.
3. Trace bilateral scrotal hydroceles.

## 2023-09-06 ENCOUNTER — Encounter: Payer: Self-pay | Admitting: Dermatology

## 2023-09-06 ENCOUNTER — Ambulatory Visit: Admitting: Dermatology

## 2023-09-06 DIAGNOSIS — D225 Melanocytic nevi of trunk: Secondary | ICD-10-CM | POA: Diagnosis not present

## 2023-09-06 DIAGNOSIS — D229 Melanocytic nevi, unspecified: Secondary | ICD-10-CM

## 2023-09-06 DIAGNOSIS — L918 Other hypertrophic disorders of the skin: Secondary | ICD-10-CM | POA: Diagnosis not present

## 2023-09-06 DIAGNOSIS — Q828 Other specified congenital malformations of skin: Secondary | ICD-10-CM

## 2023-09-06 NOTE — Progress Notes (Signed)
   Follow-Up Visit   Subjective  Vincent Pollard. is a 48 y.o. male who presents for the following: follow up on mole on back; no new symptoms. The patient has spots, moles and lesions to be evaluated, some may be new or changing.  The following portions of the chart were reviewed this encounter and updated as appropriate: medications, allergies, medical history  Review of Systems:  No other skin or systemic complaints except as noted in HPI or Assessment and Plan.  Objective  Well appearing patient in no apparent distress; mood and affect are within normal limits.  A focused examination was performed of the following areas: back  Relevant exam findings are noted in the Assessment and Plan.    Assessment & Plan   MELANOCYTIC NEVUS Exam: Tan-brown and/or pink-flesh-colored symmetric macule of lower right back.   Treatment Plan: Benign appearing on exam today. Recommend observation. Call clinic for new or changing moles. Recommend daily use of broad spectrum spf 30+ sunscreen to sun-exposed areas.   Acrochordons (Skin Tags) - Fleshy, skin-colored pedunculated papules - Benign appearing.  - Observe. - If desired, they can be removed with an in office procedure that is not covered by insurance. - Please call the clinic if you notice any new or changing lesions.     Return in about 1 year (around 09/05/2024).  Maurine Sovereign, RN, am acting as scribe for Deneise Finlay, MD .   Documentation: I have reviewed the above documentation for accuracy and completeness, and I agree with the above.  Deneise Finlay, MD

## 2023-09-06 NOTE — Patient Instructions (Signed)

## 2023-09-09 ENCOUNTER — Other Ambulatory Visit: Payer: Self-pay | Admitting: Family Medicine

## 2023-09-09 DIAGNOSIS — E785 Hyperlipidemia, unspecified: Secondary | ICD-10-CM

## 2023-09-13 ENCOUNTER — Other Ambulatory Visit: Payer: Self-pay | Admitting: Family Medicine

## 2023-09-13 DIAGNOSIS — F419 Anxiety disorder, unspecified: Secondary | ICD-10-CM

## 2023-09-13 DIAGNOSIS — E785 Hyperlipidemia, unspecified: Secondary | ICD-10-CM

## 2023-09-14 NOTE — Telephone Encounter (Signed)
 Received refills for: venlafaxine , rosuvastatin  and fenofibrate .  Pt last seen 11/2022 and past due for follow up. Left message for pt to return my call to schedule OV before refills are sent to pharmacy.

## 2023-09-17 ENCOUNTER — Ambulatory Visit: Admitting: Family Medicine

## 2023-09-21 ENCOUNTER — Ambulatory Visit: Admitting: Family Medicine

## 2023-09-21 ENCOUNTER — Encounter: Payer: Self-pay | Admitting: Family Medicine

## 2023-09-21 DIAGNOSIS — F419 Anxiety disorder, unspecified: Secondary | ICD-10-CM

## 2023-09-21 MED ORDER — VENLAFAXINE HCL ER 37.5 MG PO CP24: 37.5000 mg | ORAL_CAPSULE | Freq: Every day | ORAL | 2 refills | Status: DC

## 2023-09-21 NOTE — Patient Instructions (Signed)

## 2023-09-21 NOTE — Progress Notes (Signed)
 Established Patient Office Visit  Subjective   Patient ID: Vincent Cleckler., male    DOB: 04/04/1976  Age: 48 y.o. MRN: [id number]  Chief Complaint  Patient presents with   Follow-up   Anxiety    HPI Discussed the use of AI scribe software for clinical note transcription with the patient, who gave verbal consent to proceed.  History of Present Illness Vincent Emanuelson. is a 48 year old male who presents for follow-up regarding his Effexor  medication.  He takes Effexor  once daily and feels significantly more relaxed. Initially, he experienced dizziness and emotional changes, which he attributes to the medication, but these symptoms have since resolved.  His wife has noticed a positive change in his demeanor, and he feels different as well. He does not feel the need to increase the dose at this time.   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   Preventative health care 11/25/2017   Chest pain 09/26/2015   Hyperlipidemia 09/26/2015   Obesity (BMI 30-39.9) 06/08/2013   Left shoulder pain 06/08/2013   Past Medical History:  Diagnosis Date   GERD (gastroesophageal reflux disease)    with certain foods   Hyperlipidemia    on meds   Seasonal allergies    Torn meniscus    right leg   Past Surgical History:  Procedure Laterality Date   MASS EXCISION  04/20/2005   from the left thumb   MENISCUS REPAIR Right 2019   SHOULDER SURGERY Left 04/20/2013   dr Hazeline Lister   VASECTOMY     Social History   Tobacco Use   Smoking status: Never   Smokeless tobacco: Never  Vaping Use   Vaping status: Never Used  Substance Use Topics   Alcohol use: Yes    Comment: once per month   Drug use: No   Social History   Socioeconomic History   Marital status: Married    Spouse name: Not on file   Number of children: Not on file   Years of education: Not on file   Highest education level: Not on file  Occupational History   Occupation:  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    Occupation: mobile detail    Comment: self employed  Tobacco Use   Smoking status: Never   Smokeless tobacco: Never  Vaping Use   Vaping status: Never Used  Substance and Sexual Activity   Alcohol use: Yes    Comment: once per month   Drug use: No   Sexual activity: Yes    Partners: Female  Other Topics Concern   Not on file  Social History Narrative   Exercise--- walking 4 days a weeks for 40 min    Social Drivers of Corporate investment banker Strain: Not on file  Food Insecurity: Not on file  Transportation Needs: Not on file  Physical Activity: Not on file  Stress: Not on file  Social Connections: Unknown (10/26/2021)   Received from [facility]   Social Network    Social Network: Not on file  Intimate Partner Violence: Unknown (10/26/2021)   Received from [employer] [facility], [facility]   HITS    Physically Hurt: Not on file    Insult or Talk Down To: Not on file    Threaten Physical Harm: Not on file    Scream or Curse: Not on file   Family Status  Relation Name Status   Mother  Alive   Father  Alive   Sister  Alive   MGM  Alive   MGF  Deceased   PGM  Alive   PGF  Alive   Pat Uncle  Alive   Deatra Face Uncle  Deceased at age 5   Neg Hx  (Not Specified)  No partnership data on file   Family History  Problem Relation Age of Onset   Hypertension Mother    Diabetes Mother    Colon polyps Father 46   Hypertension Father    Prostate cancer Father    Diabetes Sister    Hypertension Sister    Polycystic ovary syndrome Sister    Diabetes Maternal Grandmother    AAA (abdominal aortic aneurysm) Maternal Grandmother    Stroke Maternal Grandfather    Diabetes Maternal Grandfather    Heart disease Maternal Grandfather    Arthritis Paternal Grandmother    Colon cancer Paternal Grandfather 9   Colon polyps Paternal Grandfather 38   Arthritis Paternal Grandfather    Prostate cancer Paternal Grandfather 16    Prostate cancer Paternal Uncle    Prostate cancer Paternal Uncle    Esophageal cancer Neg Hx    Rectal cancer Neg Hx    Stomach cancer Neg Hx    Allergies  Allergen Reactions   Guaifenesin Other (See Comments)    Insomnia   Penicillins Other (See Comments)    Childhood reaction unknown      ROS    Objective:     BP 120/78 (BP Location: Left Arm, Patient Position: Sitting, Cuff Size: Large)   Pulse (!) 114   Temp 98 F (36.7 C) (Oral)   Resp 16   Ht 5' 10.5" (1.791 m)   Wt 243 lb (110.2 kg)   SpO2 98%   BMI 34.37 kg/m  BP Readings from Last 3 Encounters:  09/21/23 120/78  03/01/23 134/79  12/15/22 118/80   Wt Readings from Last 3 Encounters:  09/21/23 243 lb (110.2 kg)  12/15/22 231 lb 6.4 oz (105 kg)  06/01/22 242 lb 4 oz (109.9 kg)   SpO2 Readings from Last 3 Encounters:  09/21/23 98%  12/15/22 96%  06/01/22 98%      Physical Exam Vitals and nursing note reviewed.  Constitutional:      General: He is not in acute distress.    Appearance: He is well-developed.  Eyes:     General: No scleral icterus.       Right eye: No discharge.        Left eye: No discharge.  Pulmonary:     Effort: Pulmonary effort is normal.  Neurological:     Mental Status: He is alert and oriented to person, place, and time.  Psychiatric:        Mood and Affect: Mood normal.        Behavior: Behavior normal.        Thought Content: Thought content normal.        Judgment: Judgment normal.      No results found for any visits on 09/21/23.  Last CBC Lab Results  Component Value Date   WBC 5.5 12/15/2022   HGB 14.4 12/15/2022   HCT 44.0 12/15/2022   MCV 90.2 12/15/2022   RDW 13.1 12/15/2022   PLT 281.0 12/15/2022   Last metabolic panel Lab Results  Component Value Date   GLUCOSE 99 12/15/2022   NA 142 12/15/2022   K 4.0 12/15/2022   CL 105 12/15/2022   CO2 27 12/15/2022  BUN 18 12/15/2022   CREATININE 1.38 12/15/2022   GFR 60.52 12/15/2022   CALCIUM  10.3  12/15/2022   PROT 7.3 12/15/2022   ALBUMIN 4.7 12/15/2022   BILITOT 0.6 12/15/2022   ALKPHOS 30 (L) 12/15/2022   AST 21 12/15/2022   ALT 30 12/15/2022   Last lipids Lab Results  Component Value Date   CHOL 221 (H) 12/15/2022   HDL 39.20 12/15/2022   LDLCALC 148 (H) 12/15/2022   LDLDIRECT 213.0 11/29/2018   TRIG 168.0 (H) 12/15/2022   CHOLHDL 6 12/15/2022   Last hemoglobin A1c Lab Results  Component Value Date   HGBA1C 6.3 09/29/2021   Last thyroid  functions Lab Results  Component Value Date   TSH 1.36 12/15/2022   Last vitamin D No results found for: "25OHVITD2", "25OHVITD3", "VD25OH" Last vitamin B12 and Folate No results found for: "VITAMINB12", "FOLATE"    The 10-year ASCVD risk score (Arnett DK, et al., 2019) is: 5%    Assessment & Plan:   Problem List Items Addressed This Visit   None Visit Diagnoses       Anxiety       Relevant Medications   venlafaxine  XR (EFFEXOR -XR) 37.5 MG 24 hr capsule     Assessment and Plan Assessment & Plan Depression   Currently on venlafaxine  37.5 mg daily with improved relaxation. Initial side effects of dizziness and emotional changes have resolved. His wife notes a positive change in demeanor. Prefers to maintain the current dose, but an increase to 75 mg is available if needed. Continue venlafaxine  37.5 mg daily. Send a 90-day supply to [facility] [address]. Reassess the need for dose adjustment during the next visit.  General Health Maintenance   Has not had a physical examination since August of the previous year. Schedule a physical examination within the next three months to assess overall health and address any potential issues.    Return in about 3 months (around 12/22/2023) for annual exam, fasting.    Messi Twedt R Lowne Chase, DO

## 2023-10-17 ENCOUNTER — Other Ambulatory Visit: Payer: Self-pay | Admitting: Family Medicine

## 2023-10-17 DIAGNOSIS — E785 Hyperlipidemia, unspecified: Secondary | ICD-10-CM

## 2023-12-02 ENCOUNTER — Telehealth: Admitting: Physician Assistant

## 2023-12-02 DIAGNOSIS — M5442 Lumbago with sciatica, left side: Secondary | ICD-10-CM | POA: Diagnosis not present

## 2023-12-02 MED ORDER — PREDNISONE 10 MG (21) PO TBPK: ORAL_TABLET | ORAL | 0 refills | Status: DC

## 2023-12-02 MED ORDER — CYCLOBENZAPRINE HCL 10 MG PO TABS: 10.0000 mg | ORAL_TABLET | Freq: Three times a day (TID) | ORAL | 0 refills | Status: DC | PRN

## 2023-12-02 NOTE — Progress Notes (Signed)
 Message sent to patient requesting further input regarding current symptoms. Awaiting patient response.

## 2023-12-02 NOTE — Progress Notes (Signed)

## 2023-12-02 NOTE — Progress Notes (Signed)
 I have spent 5 minutes in review of e-visit questionnaire, review and updating patient chart, medical decision making and response to patient.   Elsie Velma Lunger, PA-C

## 2023-12-28 ENCOUNTER — Encounter: Payer: Self-pay | Admitting: Family Medicine

## 2023-12-28 ENCOUNTER — Ambulatory Visit: Admitting: Family Medicine

## 2023-12-28 VITALS — BP 112/80 | HR 80 | Temp 98.3°F | Resp 18 | Ht 70.5 in | Wt 242.4 lb

## 2023-12-28 DIAGNOSIS — Z0000 Encounter for general adult medical examination without abnormal findings: Secondary | ICD-10-CM | POA: Diagnosis not present

## 2023-12-28 DIAGNOSIS — F419 Anxiety disorder, unspecified: Secondary | ICD-10-CM | POA: Diagnosis not present

## 2023-12-28 DIAGNOSIS — E785 Hyperlipidemia, unspecified: Secondary | ICD-10-CM

## 2023-12-28 DIAGNOSIS — R739 Hyperglycemia, unspecified: Secondary | ICD-10-CM | POA: Diagnosis not present

## 2023-12-28 DIAGNOSIS — Z125 Encounter for screening for malignant neoplasm of prostate: Secondary | ICD-10-CM | POA: Diagnosis not present

## 2023-12-28 LAB — CBC WITH DIFFERENTIAL/PLATELET
Basophils Absolute: 0 K/uL (ref 0.0–0.1)
Basophils Relative: 0.7 % (ref 0.0–3.0)
Eosinophils Absolute: 0.1 K/uL (ref 0.0–0.7)
Eosinophils Relative: 3.2 % (ref 0.0–5.0)
HCT: 47.3 % (ref 39.0–52.0)
Hemoglobin: 15.6 g/dL (ref 13.0–17.0)
Lymphocytes Relative: 33.4 % (ref 12.0–46.0)
Lymphs Abs: 1.4 K/uL (ref 0.7–4.0)
MCHC: 32.9 g/dL (ref 30.0–36.0)
MCV: 88.8 fl (ref 78.0–100.0)
Monocytes Absolute: 0.5 K/uL (ref 0.1–1.0)
Monocytes Relative: 12.1 % — ABNORMAL HIGH (ref 3.0–12.0)
Neutro Abs: 2.1 K/uL (ref 1.4–7.7)
Neutrophils Relative %: 50.6 % (ref 43.0–77.0)
Platelets: 244 K/uL (ref 150.0–400.0)
RBC: 5.32 Mil/uL (ref 4.22–5.81)
RDW: 13.5 % (ref 11.5–15.5)
WBC: 4.2 K/uL (ref 4.0–10.5)

## 2023-12-28 LAB — COMPREHENSIVE METABOLIC PANEL WITH GFR
ALT: 36 U/L (ref 0–53)
AST: 22 U/L (ref 0–37)
Albumin: 4.8 g/dL (ref 3.5–5.2)
Alkaline Phosphatase: 32 U/L — ABNORMAL LOW (ref 39–117)
BUN: 12 mg/dL (ref 6–23)
CO2: 31 meq/L (ref 19–32)
Calcium: 10.2 mg/dL (ref 8.4–10.5)
Chloride: 102 meq/L (ref 96–112)
Creatinine, Ser: 1.12 mg/dL (ref 0.40–1.50)
GFR: 77.19 mL/min (ref >60.00)
Glucose, Bld: 149 mg/dL — ABNORMAL HIGH (ref 70–99)
Potassium: 4.7 meq/L (ref 3.5–5.1)
Sodium: 138 meq/L (ref 135–145)
Total Bilirubin: 0.6 mg/dL (ref 0.2–1.2)
Total Protein: 7.6 g/dL (ref 6.0–8.3)

## 2023-12-28 LAB — LIPID PANEL
Cholesterol: 201 mg/dL — ABNORMAL HIGH (ref 0–200)
HDL: 45 mg/dL (ref >39.00)
LDL Cholesterol: 137 mg/dL — ABNORMAL HIGH (ref 0–99)
NonHDL: 155.85
Total CHOL/HDL Ratio: 4
Triglycerides: 95 mg/dL (ref 0.0–149.0)
VLDL: 19 mg/dL (ref 0.0–40.0)

## 2023-12-28 LAB — PSA: PSA: 1.29 ng/mL (ref 0.10–4.00)

## 2023-12-28 LAB — TSH: TSH: 1.59 u[IU]/mL (ref 0.35–5.50)

## 2023-12-28 LAB — HEMOGLOBIN A1C: Hgb A1c MFr Bld: 7.6 % — ABNORMAL HIGH (ref 4.6–6.5)

## 2023-12-28 MED ORDER — VENLAFAXINE HCL ER 75 MG PO CP24: 75.0000 mg | ORAL_CAPSULE | Freq: Every day | ORAL | 3 refills | Status: AC

## 2023-12-28 NOTE — Progress Notes (Signed)
 Subjective:    Patient ID: Vincent MALVA Bonnie Mickey., male    DOB: 06-27-1975, 48 y.o.   MRN: [id number]  Chief Complaint  Patient presents with   Annual Exam    Pt states fasting     HPI Patient is in today for cpe. Discussed the use of AI scribe software for clinical note transcription with the patient, who gave verbal consent to proceed.  History of Present Illness Vincent Ledin. is a 48 year old male who presents for an annual physical exam.  He was last seen three months ago and is currently taking Effexor  at a low dose of 37.5 mg. He sometimes feels 'a little worked up' and has previously increased his dose to 75 mg on a particularly stressful day, which helped to 'mellow' him out. He is no longer taking a muscle relaxer, which was previously used in conjunction with prednisone  for back issues. He continues to take rosuvastatin  and fenofibrate .  He is self-employed in Runner, broadcasting/film/video and walks three days a week, noting that the summer heat affected his routine. He works outside year-round. He and his wife went on several vacations to the beach over the summer, including trips to [address], [place], and [place]. He plans to go on another trip next week.  He is up to date with his colonoscopy and visits the eye doctor annually and the dentist twice a year. He recently visited a dermatologist who removed a mole from near his eye, which was benign and was affecting his vision. The dermatologist performed a skin check and removed a mole, which the patient reports was benign.  No joint issues, although he notes some 'popping and cracking' in his knees as he ages. He has not received a flu shot and expresses hesitance due to a past experience with his daughter getting sick after receiving the vaccine as a baby. His tetanus vaccination is up to date until 2029 and his next colonoscopy is due in 2032. He inquires about regular PSA checks for prostate health and confirms no current  issues.    Past Medical History:  Diagnosis Date   GERD (gastroesophageal reflux disease)    with certain foods   Hyperlipidemia    on meds   Seasonal allergies    Torn meniscus    right leg    Past Surgical History:  Procedure Laterality Date   MASS EXCISION  04/20/2005   from the left thumb   MENISCUS REPAIR Right 2019   SHOULDER SURGERY Left 04/20/2013   dr gerome   VASECTOMY      Family History  Problem Relation Age of Onset   Hypertension Mother    Diabetes Mother    Colon polyps Father 12   Hypertension Father    Prostate cancer Father    Diabetes Sister    Hypertension Sister    Polycystic ovary syndrome Sister    Diabetes Maternal Grandmother    AAA (abdominal aortic aneurysm) Maternal Grandmother    Stroke Maternal Grandfather    Diabetes Maternal Grandfather    Heart disease Maternal Grandfather    Arthritis Paternal Grandmother    Colon cancer Paternal Grandfather 15   Colon polyps Paternal Grandfather 15   Arthritis Paternal Grandfather    Prostate cancer Paternal Grandfather 57   Prostate cancer Paternal Uncle    Prostate cancer Paternal Uncle    Esophageal cancer Neg Hx    Rectal cancer Neg Hx    Stomach cancer Neg Hx  Social History   Socioeconomic History   Marital status: Married    Spouse name: Not on file   Number of children: Not on file   Years of education: Not on file   Highest education level: Not on file  Occupational History   Occupation: self employed    Comment: auto detail    Occupation: mobile detail    Comment: self employed  Tobacco Use   Smoking status: Never   Smokeless tobacco: Never  Vaping Use   Vaping status: Never Used  Substance and Sexual Activity   Alcohol use: Yes    Comment: once per month   Drug use: No   Sexual activity: Yes    Partners: Female  Other Topics Concern   Not on file  Social History Narrative   Exercise--- walking 3 days a weeks for 40 min    Social Drivers of Manufacturing engineer Strain: Not on file  Food Insecurity: Not on file  Transportation Needs: Not on file  Physical Activity: Not on file  Stress: Not on file  Social Connections: Unknown (10/26/2021)   Received from [facility]   Social Network    Social Network: Not on file  Intimate Partner Violence: Unknown (10/26/2021)   Received from [facility]   HITS    Physically Hurt: Not on file    Insult or Talk Down To: Not on file    Threaten Physical Harm: Not on file    Scream or Curse: Not on file    Outpatient Medications Prior to Visit  Medication Sig Dispense Refill   fenofibrate  160 MG tablet Take 1 tablet (160 mg total) by mouth daily. 90 tablet 0   rosuvastatin  (CRESTOR ) 20 MG tablet Take 1 tablet (20 mg total) by mouth daily. 90 tablet 0   cyclobenzaprine  (FLEXERIL ) 10 MG tablet Take 1 tablet (10 mg total) by mouth 3 (three) times daily as needed. 15 tablet 0   predniSONE  (STERAPRED UNI-PAK 21 TAB) 10 MG (21) TBPK tablet Take following package directions 21 tablet 0   venlafaxine  XR (EFFEXOR -XR) 37.5 MG 24 hr capsule Take 1 capsule (37.5 mg total) by mouth daily with breakfast. APPT FOR FURTHER REFILLS 90 capsule 2   No facility-administered medications prior to visit.    Allergies  Allergen Reactions   Guaifenesin Other (See Comments)    Insomnia   Penicillins Other (See Comments)    Childhood reaction unknown    Review of Systems  Constitutional:  Negative for chills, fever and malaise/fatigue.  HENT:  Negative for congestion and hearing loss.   Eyes:  Negative for blurred vision and discharge.  Respiratory:  Negative for cough, sputum production and shortness of breath.   Cardiovascular:  Negative for chest pain, palpitations and leg swelling.  Gastrointestinal:  Negative for abdominal pain, blood in stool, constipation, diarrhea, heartburn, nausea and vomiting.  Genitourinary:  Negative for dysuria, frequency, hematuria and urgency.  Musculoskeletal:   Negative for back pain, falls and myalgias.  Skin:  Negative for rash.  Neurological:  Negative for dizziness, sensory change, loss of consciousness, weakness and headaches.  Endo/Heme/Allergies:  Negative for environmental allergies. Does not bruise/bleed easily.  Psychiatric/Behavioral:  Negative for depression and suicidal ideas. The patient is not nervous/anxious and does not have insomnia.        Objective:    Physical Exam Vitals and nursing note reviewed.  Constitutional:      General: He is not in acute distress.    Appearance:  Normal appearance. He is well-developed.  HENT:     Head: Normocephalic and atraumatic.     Right Ear: Tympanic membrane, ear canal and external ear normal. There is no impacted cerumen.     Left Ear: Tympanic membrane, ear canal and external ear normal. There is no impacted cerumen.     Nose: Nose normal.     Mouth/Throat:     Mouth: Mucous membranes are moist.     Pharynx: Oropharynx is clear. No oropharyngeal exudate or posterior oropharyngeal erythema.  Eyes:     General: No scleral icterus.       Right eye: No discharge.        Left eye: No discharge.     Conjunctiva/sclera: Conjunctivae normal.     Pupils: Pupils are equal, round, and reactive to light.  Neck:     Thyroid : No thyromegaly.     Vascular: No JVD.  Cardiovascular:     Rate and Rhythm: Normal rate and regular rhythm.     Heart sounds: Normal heart sounds. No murmur heard. Pulmonary:     Effort: Pulmonary effort is normal. No respiratory distress.     Breath sounds: Normal breath sounds.  Abdominal:     General: Bowel sounds are normal. There is no distension.     Palpations: Abdomen is soft. There is no mass.     Tenderness: There is no abdominal tenderness. There is no guarding or rebound.  Musculoskeletal:        General: Normal range of motion.     Cervical back: Normal range of motion and neck supple.     Right lower leg: No edema.     Left lower leg: No edema.   Lymphadenopathy:     Cervical: No cervical adenopathy.  Skin:    General: Skin is warm and dry.     Findings: No erythema or rash.  Neurological:     Mental Status: He is alert and oriented to person, place, and time.     Cranial Nerves: No cranial nerve deficit.     Motor: No abnormal muscle tone.     Deep Tendon Reflexes: Reflexes are normal and symmetric. Reflexes normal.  Psychiatric:        Mood and Affect: Mood normal.        Behavior: Behavior normal.        Thought Content: Thought content normal.        Judgment: Judgment normal.     BP 112/80 (BP Location: Right Arm, Patient Position: Sitting, Cuff Size: Large)   Pulse 80   Temp 98.3 F (36.8 C) (Oral)   Resp 18   Ht 5' 10.5 (1.791 m)   Wt 242 lb 6.4 oz (110 kg)   SpO2 97%   BMI 34.29 kg/m  Wt Readings from Last 3 Encounters:  12/28/23 242 lb 6.4 oz (110 kg)  09/21/23 243 lb (110.2 kg)  12/15/22 231 lb 6.4 oz (105 kg)    Diabetic Foot Exam - Simple   No data filed    Lab Results  Component Value Date   WBC 5.5 12/15/2022   HGB 14.4 12/15/2022   HCT 44.0 12/15/2022   PLT 281.0 12/15/2022   GLUCOSE 99 12/15/2022   CHOL 221 (H) 12/15/2022   TRIG 168.0 (H) 12/15/2022   HDL 39.20 12/15/2022   LDLDIRECT 213.0 11/29/2018   LDLCALC 148 (H) 12/15/2022   ALT 30 12/15/2022   AST 21 12/15/2022   NA 142 12/15/2022   K 4.0  12/15/2022   CL 105 12/15/2022   CREATININE 1.38 12/15/2022   BUN 18 12/15/2022   CO2 27 12/15/2022   TSH 1.36 12/15/2022   PSA 0.95 09/29/2021   HGBA1C 6.3 09/29/2021    Lab Results  Component Value Date   TSH 1.36 12/15/2022   Lab Results  Component Value Date   WBC 5.5 12/15/2022   HGB 14.4 12/15/2022   HCT 44.0 12/15/2022   MCV 90.2 12/15/2022   PLT 281.0 12/15/2022   Lab Results  Component Value Date   NA 142 12/15/2022   K 4.0 12/15/2022   CO2 27 12/15/2022   GLUCOSE 99 12/15/2022   BUN 18 12/15/2022   CREATININE 1.38 12/15/2022   BILITOT 0.6 12/15/2022    ALKPHOS 30 (L) 12/15/2022   AST 21 12/15/2022   ALT 30 12/15/2022   PROT 7.3 12/15/2022   ALBUMIN 4.7 12/15/2022   CALCIUM  10.3 12/15/2022   GFR 60.52 12/15/2022   Lab Results  Component Value Date   CHOL 221 (H) 12/15/2022   Lab Results  Component Value Date   HDL 39.20 12/15/2022   Lab Results  Component Value Date   LDLCALC 148 (H) 12/15/2022   Lab Results  Component Value Date   TRIG 168.0 (H) 12/15/2022   Lab Results  Component Value Date   CHOLHDL 6 12/15/2022   Lab Results  Component Value Date   HGBA1C 6.3 09/29/2021       Assessment & Plan:  Preventative health care -     Lipid panel -     PSA -     TSH -     Comprehensive metabolic panel with GFR -     CBC with Differential/Platelet  Hyperlipidemia, unspecified hyperlipidemia type -     Lipid panel -     Comprehensive metabolic panel with GFR  Hyperglycemia -     Comprehensive metabolic panel with GFR -     Hemoglobin A1c  Anxiety -     Venlafaxine  HCl ER; Take 1 capsule (75 mg total) by mouth daily with breakfast.  Dispense: 90 capsule; Refill: 3   Assessment and Plan Assessment & Plan Adult Wellness Visit   Routine adult wellness visit with no changes in family history or new surgeries. He is self-employed in Runner, broadcasting/film/video and walks three days a week for physical activity. Colonoscopy, eye doctor visits, and dental check-ups are up to date. A benign mole was removed from the eye area by a dermatologist. He has not received a flu shot; discussed the benefits and risks of the flu vaccine, especially considering his age and work environment. Tetanus vaccination is current until 2029. PSA levels are monitored annually. Perform physical examination and order labs.  Anxiety disorder   Anxiety disorder is managed with Effexor , currently at a low dose of 37.5 mg. He reports occasional increased anxiety and previously self-adjusted the dose to 75 mg during a stressful day, which was effective. Increase  Effexor  dose to 75 mg to better manage symptoms. [facility] [address].  Hyperlipidemia   Hyperlipidemia is managed with rosuvastatin  and fenofibrate .                            ++++               Jamee JONELLE Antonio Cyndee, DO

## 2023-12-28 NOTE — Assessment & Plan Note (Signed)
 Ghm utd Check labs  See AVS  Health Maintenance  Topic Date Due   Hepatitis B Vaccines 19-59 Average Risk (1 of 3 - 19+ 3-dose series) Never done   Influenza Vaccine  07/18/2024 (Originally 11/19/2023)   DTaP/Tdap/Td (2 - Td or Tdap) 11/24/2027   Colonoscopy  01/14/2031   Hepatitis C Screening  Completed   Pneumococcal Vaccine  Aged Out   HPV VACCINES  Aged Out   Meningococcal B Vaccine  Aged Out   COVID-19 Vaccine  Discontinued   HIV Screening  Discontinued

## 2024-01-05 ENCOUNTER — Other Ambulatory Visit: Payer: Self-pay | Admitting: Family Medicine

## 2024-01-05 ENCOUNTER — Ambulatory Visit: Payer: Self-pay | Admitting: Family Medicine

## 2024-01-05 DIAGNOSIS — E785 Hyperlipidemia, unspecified: Secondary | ICD-10-CM

## 2024-01-05 DIAGNOSIS — E119 Type 2 diabetes mellitus without complications: Secondary | ICD-10-CM

## 2024-01-07 ENCOUNTER — Other Ambulatory Visit: Payer: Self-pay

## 2024-01-07 MED ORDER — METFORMIN HCL ER 500 MG PO TB24: 500.0000 mg | ORAL_TABLET | Freq: Every day | ORAL | 1 refills | Status: AC

## 2024-01-26 ENCOUNTER — Other Ambulatory Visit: Payer: Self-pay | Admitting: Family Medicine

## 2024-01-26 DIAGNOSIS — E785 Hyperlipidemia, unspecified: Secondary | ICD-10-CM

## 2024-05-10 ENCOUNTER — Other Ambulatory Visit: Payer: Self-pay | Admitting: Family Medicine

## 2024-05-10 DIAGNOSIS — E785 Hyperlipidemia, unspecified: Secondary | ICD-10-CM

## 2024-09-05 ENCOUNTER — Ambulatory Visit: Admitting: Dermatology

## 2025-01-08 ENCOUNTER — Encounter: Admitting: Family Medicine
